# Patient Record
Sex: Male | Born: 1965 | Race: White | Hispanic: No | Marital: Married | Smoking: Former smoker
Health system: Southern US, Community
[De-identification: ages and names within clinical notes are randomized; demographics above are authoritative.]

## PROBLEM LIST (undated history)

## (undated) DIAGNOSIS — E119 Type 2 diabetes mellitus without complications: Secondary | ICD-10-CM

## (undated) DIAGNOSIS — G4730 Sleep apnea, unspecified: Secondary | ICD-10-CM

## (undated) DIAGNOSIS — M109 Gout, unspecified: Secondary | ICD-10-CM

## (undated) DIAGNOSIS — I10 Essential (primary) hypertension: Secondary | ICD-10-CM

## (undated) HISTORY — PX: CHOLECYSTECTOMY: SHX55

---

## 2006-02-12 ENCOUNTER — Emergency Department (HOSPITAL_COMMUNITY): Admission: EM | Admit: 2006-02-12 | Discharge: 2006-02-12 | Payer: Self-pay | Admitting: Emergency Medicine

## 2006-03-13 ENCOUNTER — Encounter (HOSPITAL_COMMUNITY): Admission: RE | Admit: 2006-03-13 | Discharge: 2006-04-07 | Payer: Self-pay | Admitting: Preventative Medicine

## 2013-02-27 ENCOUNTER — Emergency Department (HOSPITAL_COMMUNITY): Payer: Worker's Compensation

## 2013-02-27 ENCOUNTER — Emergency Department (HOSPITAL_COMMUNITY)
Admission: EM | Admit: 2013-02-27 | Discharge: 2013-02-27 | Disposition: A | Discharge status: Home / Self Care | Payer: Worker's Compensation | Attending: Emergency Medicine | Admitting: Emergency Medicine

## 2013-02-27 ENCOUNTER — Encounter (HOSPITAL_COMMUNITY): Payer: Self-pay | Admitting: Emergency Medicine

## 2013-02-27 DIAGNOSIS — Y9289 Other specified places as the place of occurrence of the external cause: Secondary | ICD-10-CM | POA: Insufficient documentation

## 2013-02-27 DIAGNOSIS — F17200 Nicotine dependence, unspecified, uncomplicated: Secondary | ICD-10-CM | POA: Insufficient documentation

## 2013-02-27 DIAGNOSIS — IMO0002 Reserved for concepts with insufficient information to code with codable children: Secondary | ICD-10-CM | POA: Insufficient documentation

## 2013-02-27 DIAGNOSIS — Z862 Personal history of diseases of the blood and blood-forming organs and certain disorders involving the immune mechanism: Secondary | ICD-10-CM | POA: Insufficient documentation

## 2013-02-27 DIAGNOSIS — Y9389 Activity, other specified: Secondary | ICD-10-CM | POA: Insufficient documentation

## 2013-02-27 DIAGNOSIS — X503XXA Overexertion from repetitive movements, initial encounter: Secondary | ICD-10-CM | POA: Insufficient documentation

## 2013-02-27 DIAGNOSIS — Y990 Civilian activity done for income or pay: Secondary | ICD-10-CM | POA: Insufficient documentation

## 2013-02-27 DIAGNOSIS — Z8669 Personal history of other diseases of the nervous system and sense organs: Secondary | ICD-10-CM | POA: Insufficient documentation

## 2013-02-27 DIAGNOSIS — Z8639 Personal history of other endocrine, nutritional and metabolic disease: Secondary | ICD-10-CM | POA: Insufficient documentation

## 2013-02-27 DIAGNOSIS — T148XXA Other injury of unspecified body region, initial encounter: Secondary | ICD-10-CM

## 2013-02-27 DIAGNOSIS — M549 Dorsalgia, unspecified: Secondary | ICD-10-CM

## 2013-02-27 HISTORY — DX: Gout, unspecified: M10.9

## 2013-02-27 HISTORY — DX: Sleep apnea, unspecified: G47.30

## 2013-02-27 LAB — BASIC METABOLIC PANEL
CO2: 27 mEq/L (ref 19–32)
Calcium: 10 mg/dL (ref 8.4–10.5)
Chloride: 101 mEq/L (ref 96–112)
GFR calc Af Amer: >90 mL/min (ref >90)
Sodium: 140 mEq/L (ref 135–145)

## 2013-02-27 LAB — CBC WITH DIFFERENTIAL/PLATELET
Basophils Absolute: 0.1 10*3/uL (ref 0.0–0.1)
Basophils Relative: 1 % (ref 0–1)
Eosinophils Relative: 5 % (ref 0–5)
Lymphocytes Relative: 25 % (ref 12–46)
MCV: 90.4 fL (ref 78.0–100.0)
Neutro Abs: 4.7 10*3/uL (ref 1.7–7.7)
Platelets: 173 10*3/uL (ref 150–400)
RDW: 14.2 % (ref 11.5–15.5)
WBC: 7.5 10*3/uL (ref 4.0–10.5)

## 2013-02-27 MED ORDER — HYDROMORPHONE HCL PF 1 MG/ML IJ SOLN
1.0000 mg | Freq: Once | INTRAMUSCULAR | Status: AC
  Administered 2013-02-27: 1 mg via INTRAVENOUS
  Filled 2013-02-27: qty 1

## 2013-02-27 MED ORDER — METHOCARBAMOL 500 MG PO TABS: 500.0000 mg | ORAL_TABLET | Freq: Two times a day (BID) | ORAL | Status: DC

## 2013-02-27 MED ORDER — HYDROCODONE-ACETAMINOPHEN 5-325 MG PO TABS: 1.0000 | ORAL_TABLET | Freq: Four times a day (QID) | ORAL | Status: DC | PRN

## 2013-02-27 MED ORDER — IBUPROFEN 600 MG PO TABS: 600.0000 mg | ORAL_TABLET | Freq: Four times a day (QID) | ORAL | Status: DC | PRN

## 2013-02-27 MED ORDER — KETOROLAC TROMETHAMINE 30 MG/ML IJ SOLN
30.0000 mg | Freq: Once | INTRAMUSCULAR | Status: AC
  Administered 2013-02-27: 30 mg via INTRAVENOUS
  Filled 2013-02-27: qty 1

## 2013-02-27 MED ORDER — DIAZEPAM 2 MG PO TABS
2.0000 mg | ORAL_TABLET | Freq: Once | ORAL | Status: AC
  Administered 2013-02-27: 2 mg via ORAL
  Filled 2013-02-27: qty 1

## 2013-02-27 NOTE — ED Notes (Signed)
Pt alert & oriented x4, stable gait. Patient given discharge instructions, paperwork & prescription(s). Patient instructed to stop at the registration desk to finish any additional paperwork. Patient verbalized understanding. Pt left department w/ no further questions. Pt walked to lab for drug screen.

## 2013-02-27 NOTE — ED Notes (Signed)
Pt was at work, and reach for tool and pull muscle in back, complaining of pain on right side.

## 2013-02-27 NOTE — ED Provider Notes (Signed)
CSN: [id number]     Arrival date & time 02/27/13  4098 History   First MD Initiated Contact with Patient 02/27/13 [phone]     Chief Complaint  Patient presents with  . Back Pain   (Consider location/radiation/quality/duration/timing/severity/associated sxs/prior Treatment) HPI Comments: Pt comes in with cc of back pain. No hx of back pain, medical problems. Was at work, and had just finished some lifting - and few minutes later had a severe pain on the right flank side. The pain is described as sharp, "grabbing" pain, which took his breath away, and he almost fell down. Pain got slightly better, but then returned again with same intensity. The pain is located on the right lower back region, no radiating to the legs, no associated numbness, weakness, urinary incontinence, urinary retention, bowel incontinence, saddle anestheisia. Pain is worse with deep inspiration, with any movement. Pt denies any trauma, no uti like sx, no scrotal/testiculat pain and no hx of hernias, no hx of renal stones.     The history is provided by the patient.    Past Medical History  Diagnosis Date  . Gout   . Sleep apnea    Past Surgical History  Procedure Laterality Date  . Cholecystectomy     No family history on file. History  Substance Use Topics  . Smoking status: Current Every Day Smoker    Types: Cigarettes  . Smokeless tobacco: Not on file  . Alcohol Use: No    Review of Systems  Constitutional: Positive for activity change. Negative for fever.  Respiratory: Negative for cough and shortness of breath.   Cardiovascular: Negative for chest pain.  Gastrointestinal: Negative for abdominal pain.  Genitourinary: Negative for dysuria.  Musculoskeletal: Positive for back pain. Negative for neck pain.  Neurological: Negative for dizziness and numbness.    Allergies  Augmentin  Home Medications  No current outpatient prescriptions on file. There were no vitals taken for this visit. Physical  Exam  Nursing note and vitals reviewed. Constitutional: He is oriented to person, place, and time. He appears well-developed.  HENT:  Head: Normocephalic and atraumatic.  Eyes: Conjunctivae and EOM are normal.  Neck: Normal range of motion. Neck supple.  Cardiovascular: Normal rate and regular rhythm.   Pulmonary/Chest: Effort normal and breath sounds normal. He has no wheezes.  Abdominal: Soft. Bowel sounds are normal. He exhibits no distension. There is no tenderness. There is no rebound and no guarding.  Musculoskeletal:  Pt has no midline spine or point tenderness. Pain reproduced with deep inspiration and with movement/twisting. Gait normal  Neurological: He is alert and oriented to person, place, and time.  Skin: Skin is warm.    ED Course  Procedures (including critical care time) Labs Review Labs Reviewed  CBC WITH DIFFERENTIAL  BASIC METABOLIC PANEL  URINALYSIS, ROUTINE W REFLEX MICROSCOPIC   Imaging Review No results found.  EKG Interpretation   None       MDM  No diagnosis found.  Pt comes in with cc of severe back pain, sudden onset. Pain started when he was reaching out for a tool. He has no hx of pain like this. Pain is pleuritic, and positional.  CXR r.o diaphragm injury.  This is likely m-s pain. Pain meds given. PCP f/u in 1 week.  Derwood Kaplan, MD 02/27/13 [phone]

## 2013-02-27 NOTE — ED Notes (Signed)
Pt states he reached across to get a tool & pain shot thru back. Middle right side.

## 2014-04-10 IMAGING — CR DG CHEST 1V PORT
1 series · 1 of 1 positions shown · non-contrast
Comparison: None.

CLINICAL DATA: Back pain.

EXAM:
PORTABLE CHEST - 1 VIEW

[portable]
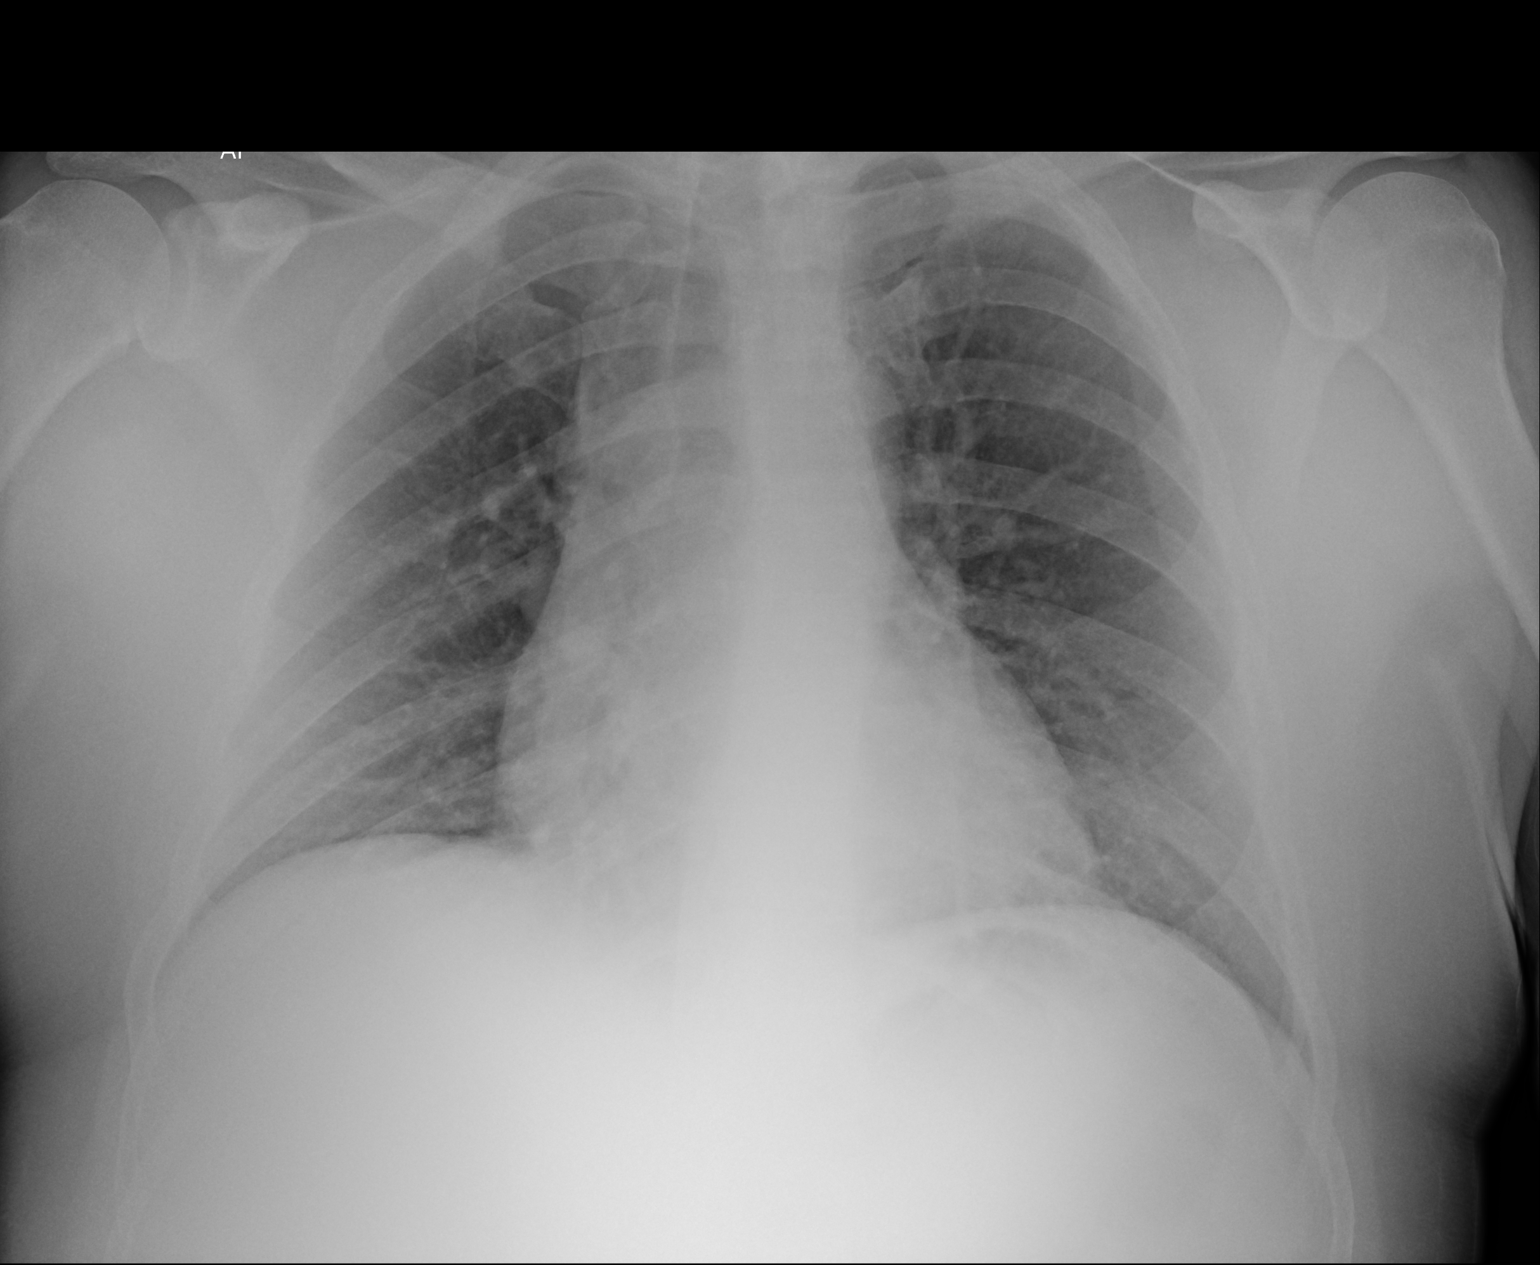

[1 of 1 positions shown; findings below may reference images not displayed]

FINDINGS: Heart size is normal. No pleural effusion or edema identified. No
airspace consolidation noted. The visualized osseous structures are
on unremarkable.
IMPRESSION: 1. No acute cardiopulmonary abnormalities.

## 2019-03-09 ENCOUNTER — Encounter (HOSPITAL_COMMUNITY): Payer: Self-pay | Admitting: Emergency Medicine

## 2019-03-09 ENCOUNTER — Other Ambulatory Visit: Payer: Self-pay

## 2019-03-09 ENCOUNTER — Emergency Department (HOSPITAL_COMMUNITY)
Admission: EM | Admit: 2019-03-09 | Discharge: 2019-03-09 | Disposition: A | Discharge status: Home / Self Care | Payer: Worker's Compensation | Attending: Emergency Medicine | Admitting: Emergency Medicine

## 2019-03-09 ENCOUNTER — Emergency Department (HOSPITAL_COMMUNITY): Payer: Worker's Compensation

## 2019-03-09 DIAGNOSIS — E119 Type 2 diabetes mellitus without complications: Secondary | ICD-10-CM | POA: Insufficient documentation

## 2019-03-09 DIAGNOSIS — S6992XA Unspecified injury of left wrist, hand and finger(s), initial encounter: Secondary | ICD-10-CM | POA: Diagnosis not present

## 2019-03-09 DIAGNOSIS — W228XXA Striking against or struck by other objects, initial encounter: Secondary | ICD-10-CM | POA: Insufficient documentation

## 2019-03-09 DIAGNOSIS — Y9289 Other specified places as the place of occurrence of the external cause: Secondary | ICD-10-CM | POA: Diagnosis not present

## 2019-03-09 DIAGNOSIS — Y990 Civilian activity done for income or pay: Secondary | ICD-10-CM | POA: Insufficient documentation

## 2019-03-09 DIAGNOSIS — I10 Essential (primary) hypertension: Secondary | ICD-10-CM | POA: Insufficient documentation

## 2019-03-09 DIAGNOSIS — F17210 Nicotine dependence, cigarettes, uncomplicated: Secondary | ICD-10-CM | POA: Diagnosis not present

## 2019-03-09 DIAGNOSIS — Y9389 Activity, other specified: Secondary | ICD-10-CM | POA: Diagnosis not present

## 2019-03-09 HISTORY — DX: Essential (primary) hypertension: I10

## 2019-03-09 HISTORY — DX: Type 2 diabetes mellitus without complications: E11.9

## 2019-03-09 MED ORDER — IBUPROFEN 400 MG PO TABS
400.0000 mg | ORAL_TABLET | Freq: Once | ORAL | Status: AC
  Administered 2019-03-09: 09:00:00 400 mg via ORAL
  Filled 2019-03-09: qty 1

## 2019-03-09 NOTE — ED Provider Notes (Addendum)
[facility] EMERGENCY DEPARTMENT Provider Note   CSN: [id number] Arrival date & time: 03/09/19  1950     History   Chief Complaint Chief Complaint  Patient presents with  . Hand Injury    HPI Clayton Gallegos is a 53 y.o. male.     Clayton Gallegos is a 53 y.o. male with a history of hypertension, diabetes, gout and sleep apnea, who presents to the ED for evaluation of left hand injury.  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  He reports that he was wearing leather gloves at the time and denies any cuts or abrasions.  He has noticed some bruising to the palm and swelling of the left hand.  Pain is most severe over the MCP joint of the middle finger, and bruising is present over the palmar aspect of this MCP joint.  Patient is able to move all fingers although it is painful.  He reports some decreased sensation but denies numbness.  He has not taken anything for pain prior to arrival.  He reports when he keeps the hand still he has very minimal pain.  No previous injuries or surgeries.  No other aggravating or alleviating factors.     Past Medical History:  Diagnosis Date  . Diabetes mellitus without complication (HCC)    Type II diet controlled  . Gout   . Hypertension   . Sleep apnea     There are no active problems to display for this patient.   Past Surgical History:  Procedure Laterality Date  . CHOLECYSTECTOMY          Home Medications    Prior to Admission medications   Medication Sig Start Date End Date Taking? Authorizing Provider  HYDROcodone-acetaminophen (NORCO/VICODIN) 5-325 MG per tablet Take 1 tablet by mouth every 6 (six) hours as needed. 02/27/13   Derwood Kaplan, MD  ibuprofen (ADVIL,MOTRIN) 600 MG tablet Take 1 tablet (600 mg total) by mouth every 6 (six) hours as needed. 02/27/13   Derwood Kaplan, MD  methocarbamol (ROBAXIN) 500 MG tablet Take 1  tablet (500 mg total) by mouth 2 (two) times daily. 02/27/13   Derwood Kaplan, MD    Family History History reviewed. No pertinent family history.  Social History Social History   Tobacco Use  . Smoking status: Current Every Day Smoker    Packs/day: 0.50    Types: Cigarettes  . Smokeless tobacco: Never Used  Substance Use Topics  . Alcohol use: No  . Drug use: No     Allergies   Augmentin [amoxicillin-pot clavulanate]   Review of Systems Review of Systems  Constitutional: Negative for chills and fever.  Musculoskeletal: Positive for arthralgias and joint swelling.  Skin: Positive for color change. Negative for wound.  Neurological: Negative for weakness and numbness.     Physical Exam Updated Vital Signs BP (!) 146/96 (BP Location: Right Arm)   Pulse 92   Temp 98.7 F (37.1 C) (Oral)   Resp 18   Ht 5\' 8"  (1.727 m)   Wt 113.4 kg   SpO2 98%   BMI 38.01 kg/m   Physical Exam Vitals signs and nursing note reviewed.  Constitutional:      General: He is not in acute distress.    Appearance: Normal appearance. He is well-developed. He is not ill-appearing or diaphoretic.  HENT:     Head:  Normocephalic and atraumatic.  Eyes:     General:        Right eye: No discharge.        Left eye: No discharge.  Pulmonary:     Effort: Pulmonary effort is normal. No respiratory distress.  Musculoskeletal:     Comments: Left hand with tenderness to palpation and swelling surrounding the MCP joint of the middle finger, there is bruising on the palmar aspect of the MCP joint, there is no obvious bony deformity.  No lacerations or abrasions.  No pain or tenderness at the wrist and patient has full range of motion.  Patient is able to give thumbs up, okay sign and spread and contracted fingers with some discomfort.  He has sensation present throughout the hand, 2+ radial pulse and good capillary refill.  Skin:    General: Skin is warm and dry.     Capillary Refill: Capillary  refill takes less than 2 seconds.  Neurological:     Mental Status: He is alert and oriented to person, place, and time.     Coordination: Coordination normal.  Psychiatric:        Mood and Affect: Mood normal.        Behavior: Behavior normal.      ED Treatments / Results  Labs (all labs ordered are listed, but only abnormal results are displayed) Labs Reviewed - No data to display  EKG None  Radiology Dg Hand Complete Left  Result Date: 03/09/2019 CLINICAL DATA:  Piece of metal fell on hand EXAM: LEFT HAND - COMPLETE 3+ VIEW COMPARISON:  None. FINDINGS: Frontal, oblique, and lateral views were obtained. There is soft tissue swelling over the dorsal aspect of the hand at the MCP joint level. No fracture or dislocation. Joint spaces appear normal. No erosive change. No radiopaque foreign body or soft tissue air. IMPRESSION: No fracture or dislocation. Soft tissue swelling dorsally at the MCP joint level. No appreciable arthropathy. No radiopaque foreign body. Electronically Signed   By: Lowella Grip III M.D.   On: 03/09/2019 07:49    Procedures Procedures (including critical care time)  Medications Ordered in ED Medications  ibuprofen (ADVIL) tablet 400 mg (has no administration in time range)     Initial Impression / Assessment and Plan / ED Course  I have reviewed the triage vital signs and the nursing notes.  Pertinent labs & imaging results that were available during my care of the patient were reviewed by me and considered in my medical decision making (see chart for details).  53 year old male presents after left hand injury just prior to arrival while at work.  Fortunately the patient was wearing leather gloves and has no cuts or abrasions to the hand but has pain and swelling and some bruising around the third MCP joint.  No obvious bony deformity.  Hand is neurovascularly intact.  X-ray shows no fracture or dislocation, but soft tissue swelling is noted at the MCP  joint consistent with exam.  Will place patient in Ace wrap for comfort, suspect soft tissue injury this should improve, ice, elevation, ibuprofen and Tylenol recommended for treatment.  Orthopedic follow-up if not improving.  Return precautions discussed.  Patient expresses understanding and agreement with plan.  Discharged home in good condition.  Final Clinical Impressions(s) / ED Diagnoses   Final diagnoses:  Injury of left hand, initial encounter    ED Discharge Orders    None       Jacqlyn Larsen, [place] 03/09/19 8185  Dartha LodgeFord, Jannis Atkins N, PA-C 03/09/19 1135    Vanetta MuldersZackowski, Scott, MD 03/10/19 [phone]

## 2019-03-09 NOTE — Discharge Instructions (Signed)
Your x-ray shows soft tissue swelling but no fractures.  Use Ace wrap for support and compression, ice and elevate the hand as much as possible over the next few days, and take ibuprofen 400 mg, and Tylenol 1000 mg, both of these can be taken every 6 hours.  If symptoms are not improving please follow-up with Dr. Aline Brochure with orthopedics.  As your symptoms improve you can continue to return to your regular activities as tolerated.

## 2019-03-09 NOTE — ED Triage Notes (Signed)
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 No cuts or abrasions noted and pt reports he was wearing leather gloves. Bruising noted to palm of left hand and swelling.

## 2020-04-19 IMAGING — DX DG HAND COMPLETE 3+V*L*
3 series · 3 of 3 positions shown · non-contrast
Comparison: None.

CLINICAL DATA: Piece of metal fell on hand

EXAM:
LEFT HAND - COMPLETE 3+ VIEW

[hand pa]
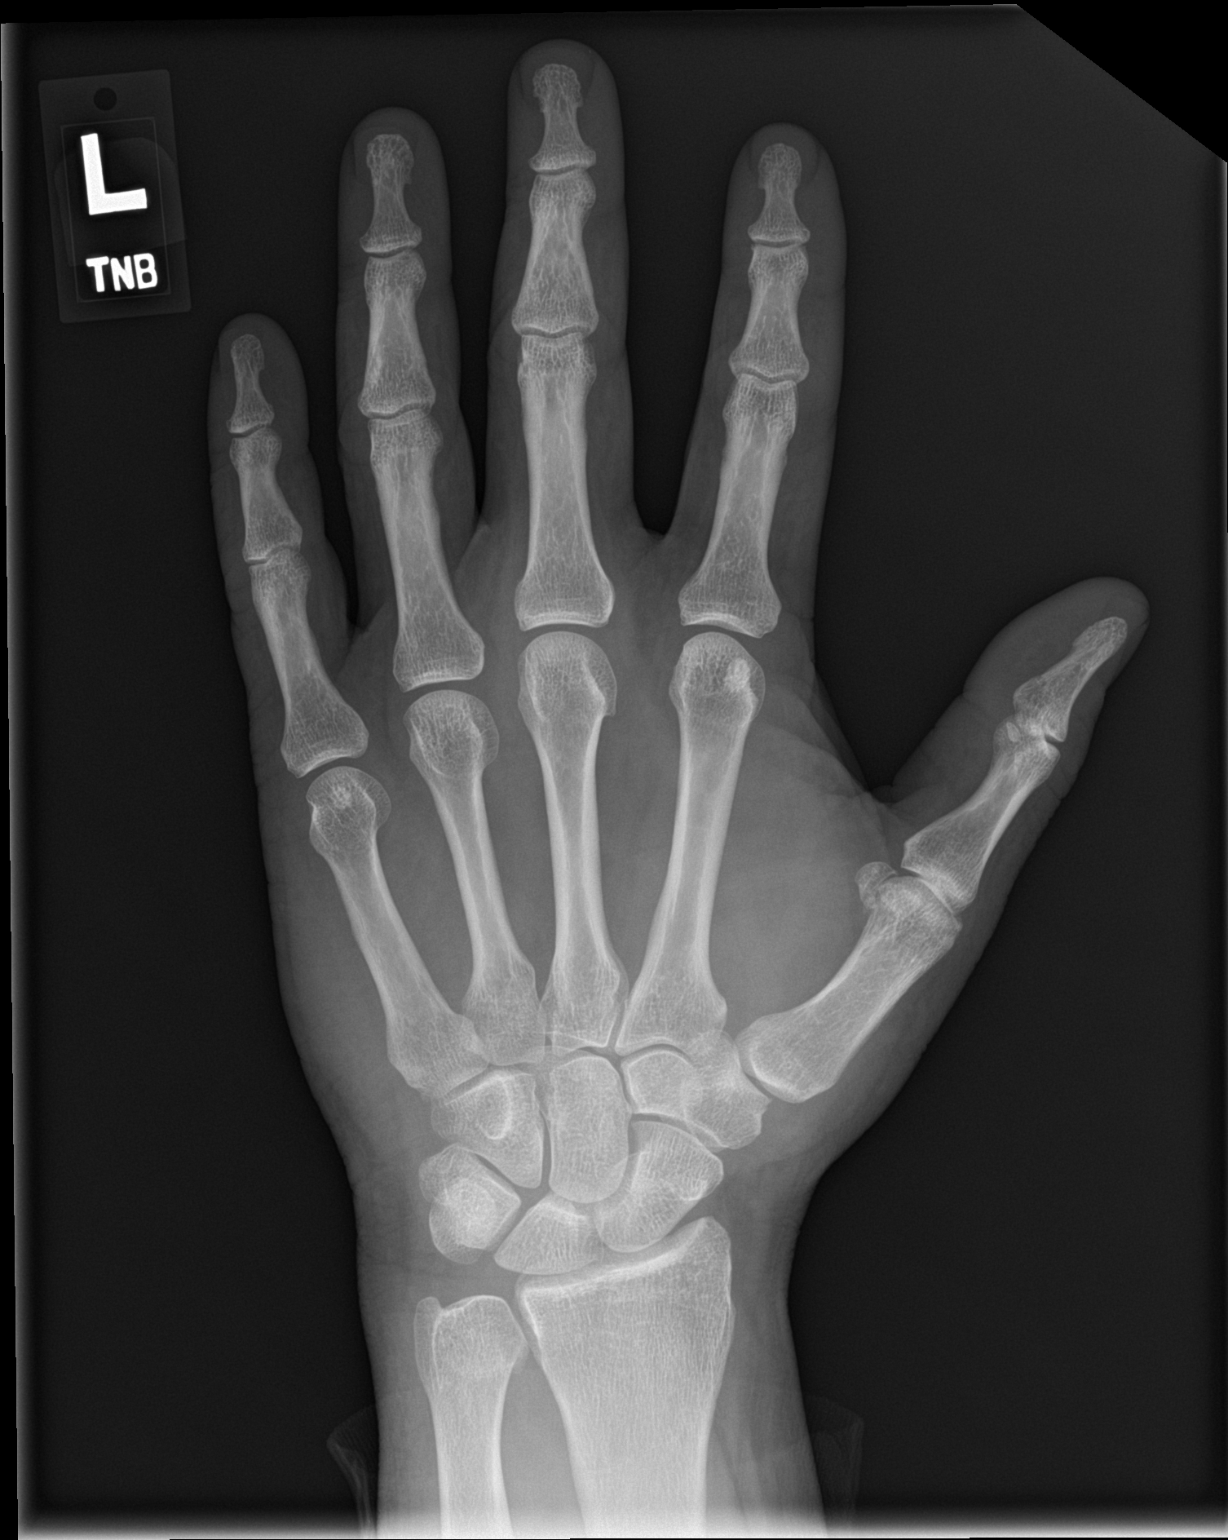

[hand obl]
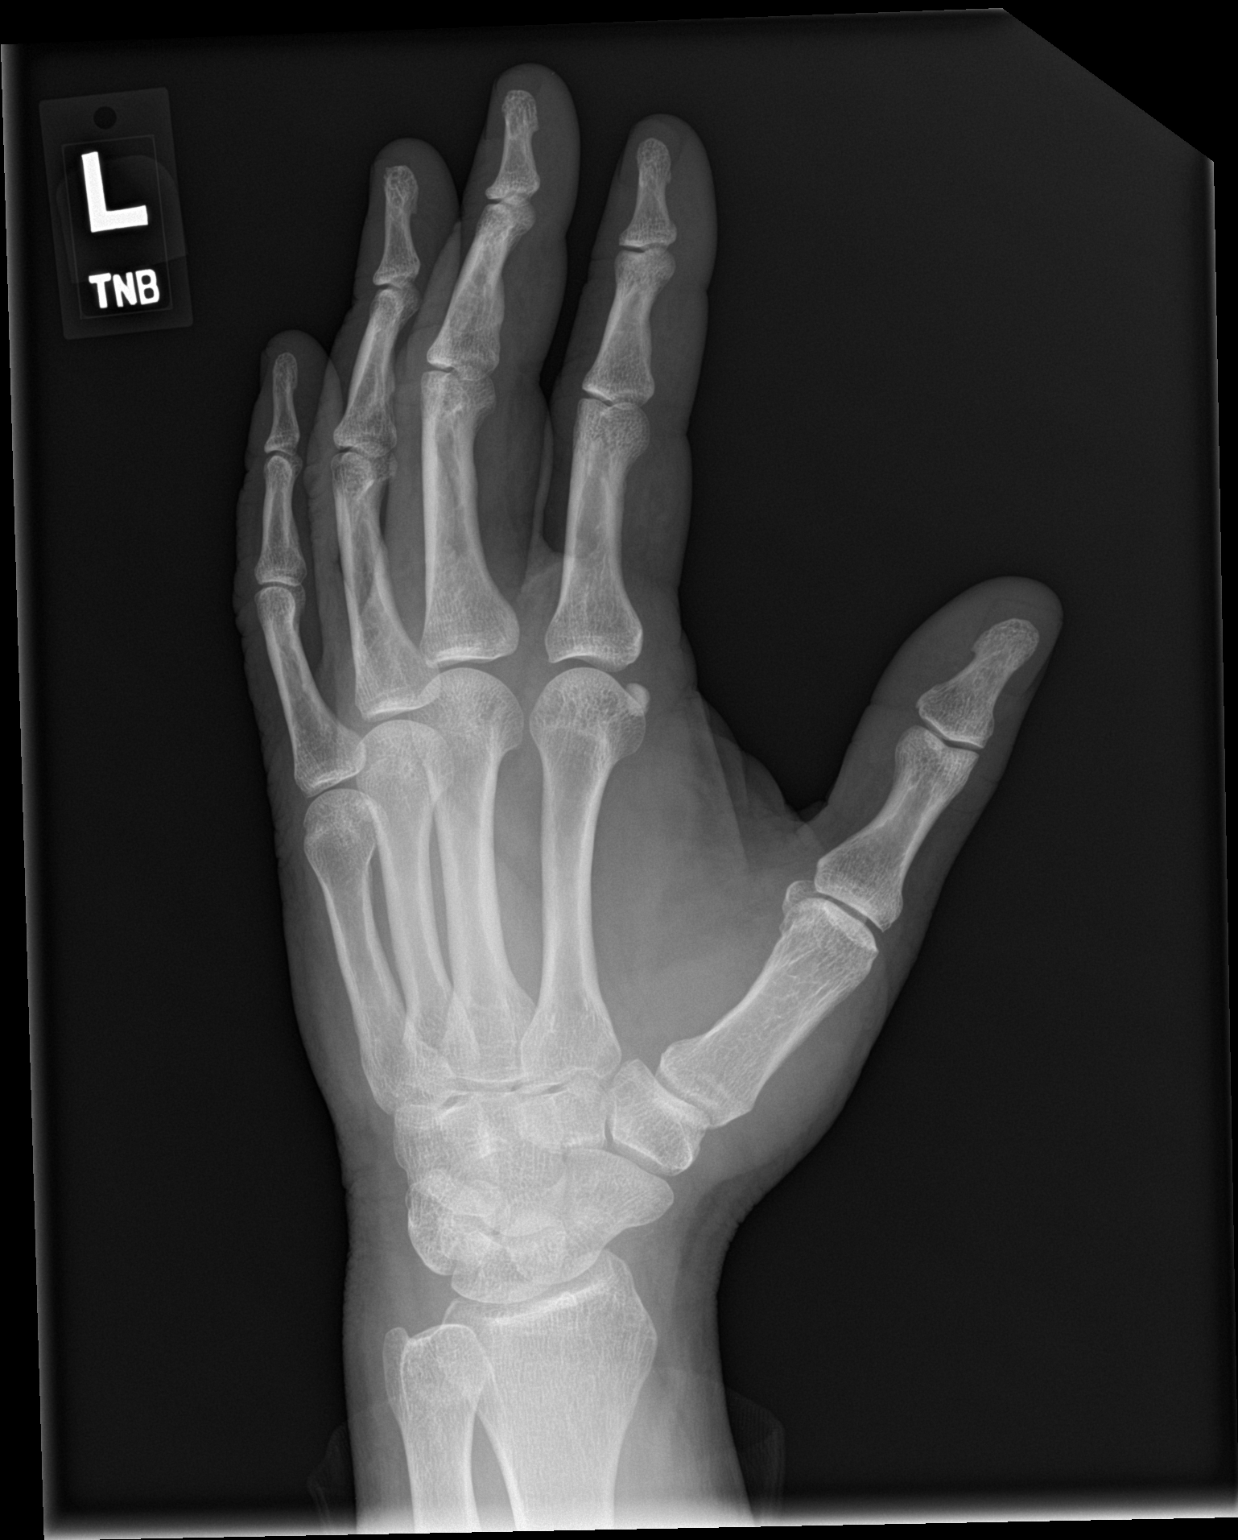

[hand lat]
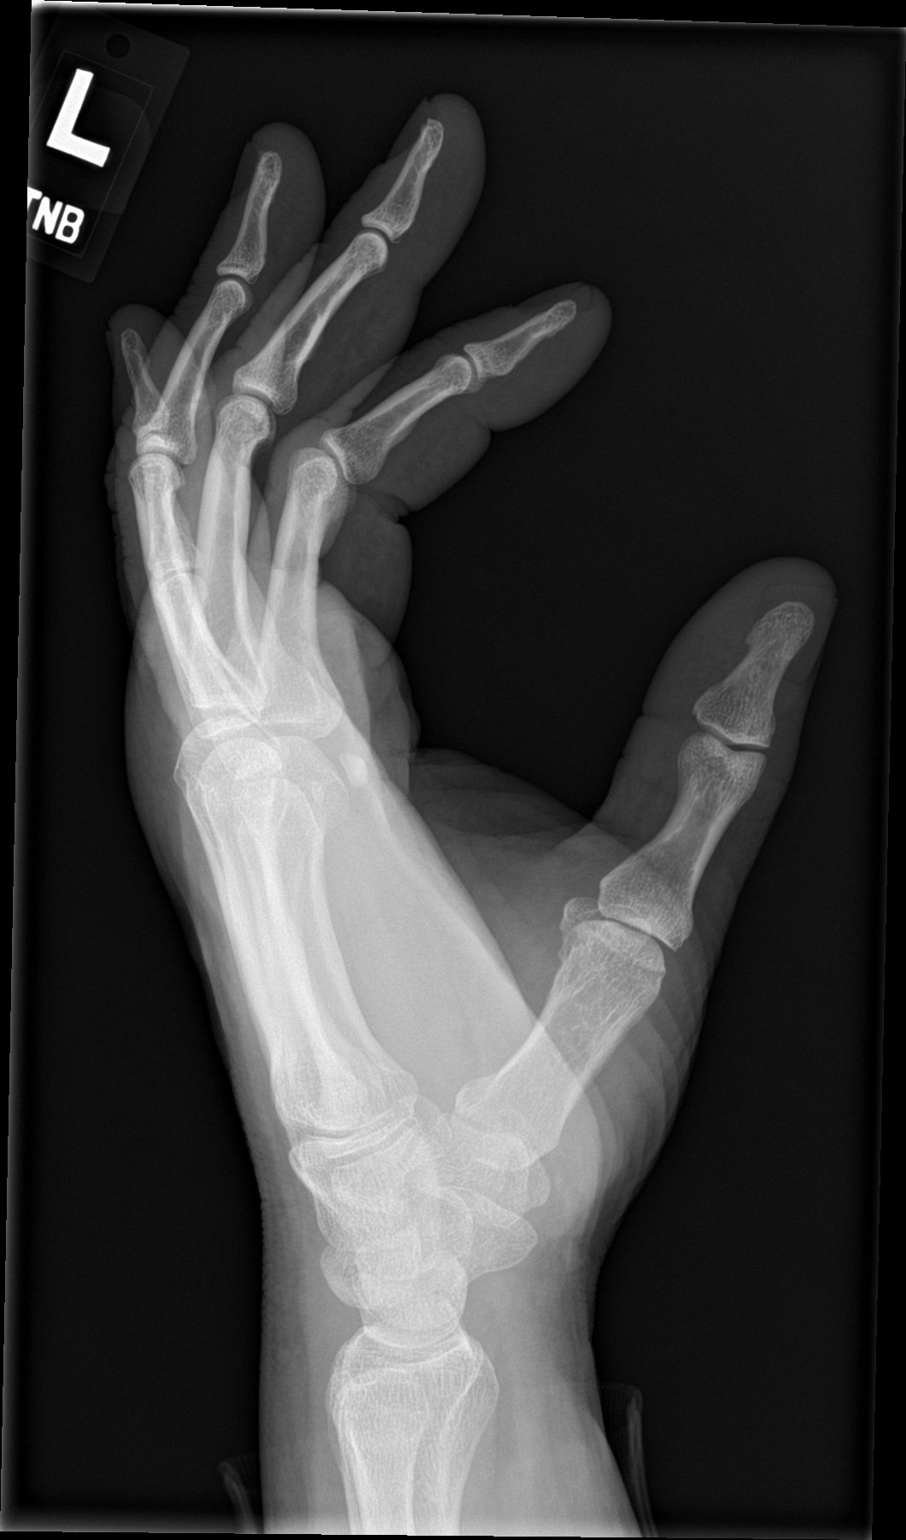

[3 of 3 positions shown; findings below may reference images not displayed]

FINDINGS: Frontal, oblique, and lateral views were obtained. There is soft
tissue swelling over the dorsal aspect of the hand at the MCP joint
level. No fracture or dislocation. Joint spaces appear normal. No
erosive change. No radiopaque foreign body or soft tissue air.
IMPRESSION: No fracture or dislocation. Soft tissue swelling dorsally at the MCP
joint level. No appreciable arthropathy. No radiopaque foreign body.

## 2021-05-18 NOTE — Progress Notes (Signed)
 Acute Care Office Visit:   [facility] 05/18/2021 with Westley KATHEE Lucks, MD    Assessment/Plan:   Encounter Diagnosis    ICD-10-CM   1. Acute bronchitis, unspecified organism  J20.9     2. 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 Will treat with Z pack and prednisone burst. Cough medicine sent. UC/ER precautions given extensively.   Rx for clobetasol ointment sent .   I have discussed the plan of care including any new or otc medications, labs as needed, imaging and follow up with pt. He is in agreement with plan of care and voices understanding of the above plan.   Subjective:    Chief Complaint  Patient presents with  . Nasal Congestion    Started Monday night. Started out as sneezing Monday night.   . Chest Congestion   EMRAN MOLZAHN is a 56 y.o. male who presents with the above issues.   rnny nose, congestion 4 days ago; heavy mucus and cough for 2 weeks prior to this.  Negative covid test yesterday.  Clear mucus from the nose.  Mostly a dry cough Running a fever yesterday, subjective.  some COPD  Patient denies fevers, chills, shortness of breath.  Would like a refill of clobetasol ointment instead of cream for history of psoriasis-like rash of the hands   Review of Systems: ROS  Objective:   Physical Examination: BP (!) 148/78   Pulse 93   Temp 97.8 F (36.6 C) (Forehead Scan)   Resp 16   Ht 1.727 m (5' 8)   Wt 95.3 kg (210 lb)   SpO2 96%   BMI 31.93 kg/m  Estimated body mass index is 31.93 kg/m as calculated from the following:   Height as of this encounter: 1.727 m (5' 8).   Weight as of this encounter: 95.3 kg (210 lb). Physical Exam Constitutional:      General: He is not in acute distress.    Appearance: He is not ill-appearing or toxic-appearing.   HENT:     Right Ear: Tympanic membrane normal.     Left Ear: Tympanic membrane normal.     Nose: Nose normal. No congestion.  Cardiovascular:     Rate and Rhythm: Normal rate and regular rhythm.     Pulses: Normal pulses.     Heart sounds: No murmur heard. Pulmonary:     Effort: Pulmonary effort is normal. No respiratory distress.     Breath sounds: Wheezing and rhonchi present.  Skin:    Findings: Lesion present.  Neurological:     Mental Status: He is alert.     Annular plaques on the hands.

## 2023-10-20 ENCOUNTER — Emergency Department (HOSPITAL_COMMUNITY)

## 2023-10-20 ENCOUNTER — Encounter (HOSPITAL_COMMUNITY): Payer: Self-pay | Admitting: Cardiology

## 2023-10-20 ENCOUNTER — Other Ambulatory Visit: Payer: Self-pay

## 2023-10-20 ENCOUNTER — Inpatient Hospital Stay (HOSPITAL_COMMUNITY)
Admission: EM | Admit: 2023-10-20 | Discharge: 2023-10-21 | DRG: 322 | Disposition: A | Discharge status: Home / Self Care | Attending: Cardiology | Admitting: Cardiology

## 2023-10-20 ENCOUNTER — Encounter (HOSPITAL_COMMUNITY)
Admission: EM | Disposition: A | Discharge status: Home / Self Care | Payer: Self-pay | Source: Home / Self Care | Attending: Cardiology

## 2023-10-20 DIAGNOSIS — Q271 Congenital renal artery stenosis: Secondary | ICD-10-CM

## 2023-10-20 DIAGNOSIS — I10 Essential (primary) hypertension: Secondary | ICD-10-CM | POA: Diagnosis present

## 2023-10-20 DIAGNOSIS — I701 Atherosclerosis of renal artery: Secondary | ICD-10-CM | POA: Diagnosis present

## 2023-10-20 DIAGNOSIS — Z79899 Other long term (current) drug therapy: Secondary | ICD-10-CM

## 2023-10-20 DIAGNOSIS — Z8249 Family history of ischemic heart disease and other diseases of the circulatory system: Secondary | ICD-10-CM | POA: Diagnosis not present

## 2023-10-20 DIAGNOSIS — E785 Hyperlipidemia, unspecified: Secondary | ICD-10-CM | POA: Diagnosis present

## 2023-10-20 DIAGNOSIS — I252 Old myocardial infarction: Secondary | ICD-10-CM | POA: Diagnosis not present

## 2023-10-20 DIAGNOSIS — E118 Type 2 diabetes mellitus with unspecified complications: Secondary | ICD-10-CM | POA: Insufficient documentation

## 2023-10-20 DIAGNOSIS — I214 Non-ST elevation (NSTEMI) myocardial infarction: Secondary | ICD-10-CM | POA: Diagnosis present

## 2023-10-20 DIAGNOSIS — E119 Type 2 diabetes mellitus without complications: Secondary | ICD-10-CM | POA: Diagnosis present

## 2023-10-20 DIAGNOSIS — F17210 Nicotine dependence, cigarettes, uncomplicated: Secondary | ICD-10-CM | POA: Diagnosis present

## 2023-10-20 DIAGNOSIS — I2510 Atherosclerotic heart disease of native coronary artery without angina pectoris: Secondary | ICD-10-CM | POA: Diagnosis present

## 2023-10-20 DIAGNOSIS — Z7982 Long term (current) use of aspirin: Secondary | ICD-10-CM

## 2023-10-20 DIAGNOSIS — Z955 Presence of coronary angioplasty implant and graft: Secondary | ICD-10-CM

## 2023-10-20 DIAGNOSIS — I255 Ischemic cardiomyopathy: Secondary | ICD-10-CM | POA: Diagnosis present

## 2023-10-20 HISTORY — PX: CORONARY PRESSURE/FFR STUDY: CATH118243

## 2023-10-20 HISTORY — PX: LEFT HEART CATH AND CORONARY ANGIOGRAPHY: CATH118249

## 2023-10-20 HISTORY — PX: CORONARY STENT INTERVENTION: CATH118234

## 2023-10-20 LAB — POCT ACTIVATED CLOTTING TIME
Activated Clotting Time: 262 s
Activated Clotting Time: 274 s
Activated Clotting Time: 297 s

## 2023-10-20 LAB — CBC
HCT: 41.1 % (ref 39.0–52.0)
Hemoglobin: 14.5 g/dL (ref 13.0–17.0)
MCH: 33.3 pg (ref 26.0–34.0)
MCHC: 35.3 g/dL (ref 30.0–36.0)
MCV: 94.5 fL (ref 80.0–100.0)
Platelets: 207 K/uL (ref 150–400)
RBC: 4.35 MIL/uL (ref 4.22–5.81)
RDW: 14.6 % (ref 11.5–15.5)
WBC: 8.9 K/uL (ref 4.0–10.5)
nRBC: 0 % (ref 0.0–0.2)

## 2023-10-20 LAB — GLUCOSE, CAPILLARY
Glucose-Capillary: 185 mg/dL — ABNORMAL HIGH (ref 70–99)
Glucose-Capillary: 259 mg/dL — ABNORMAL HIGH (ref 70–99)

## 2023-10-20 LAB — BASIC METABOLIC PANEL WITH GFR
Anion gap: 12 (ref 5–15)
BUN: 17 mg/dL (ref 6–20)
CO2: 21 mmol/L — ABNORMAL LOW (ref 22–32)
Calcium: 8.6 mg/dL — ABNORMAL LOW (ref 8.9–10.3)
Chloride: 97 mmol/L — ABNORMAL LOW (ref 98–111)
Creatinine, Ser: 1.11 mg/dL (ref 0.61–1.24)
GFR, Estimated: >60 mL/min (ref >60)
Glucose, Bld: 404 mg/dL — ABNORMAL HIGH (ref 70–99)
Potassium: 4.1 mmol/L (ref 3.5–5.1)
Sodium: 130 mmol/L — ABNORMAL LOW (ref 135–145)

## 2023-10-20 LAB — TROPONIN I (HIGH SENSITIVITY)
Troponin I (High Sensitivity): 16 ng/L (ref <18)
Troponin I (High Sensitivity): 68 ng/L — ABNORMAL HIGH (ref <18)
Troponin I (High Sensitivity): 73 ng/L — ABNORMAL HIGH (ref <18)

## 2023-10-20 LAB — HIV ANTIBODY (ROUTINE TESTING W REFLEX): HIV Screen 4th Generation wRfx: NONREACTIVE

## 2023-10-20 LAB — CBG MONITORING, ED
Glucose-Capillary: 299 mg/dL — ABNORMAL HIGH (ref 70–99)
Glucose-Capillary: 172 mg/dL — ABNORMAL HIGH (ref 70–99)

## 2023-10-20 SURGERY — LEFT HEART CATH AND CORONARY ANGIOGRAPHY
Anesthesia: LOCAL

## 2023-10-20 MED ORDER — MIDAZOLAM HCL 2 MG/2ML IJ SOLN
INTRAMUSCULAR | Status: AC
  Filled 2023-10-20: qty 2

## 2023-10-20 MED ORDER — METOPROLOL TARTRATE 12.5 MG HALF TABLET
12.5000 mg | ORAL_TABLET | Freq: Two times a day (BID) | ORAL | Status: DC
  Administered 2023-10-20 – 2023-10-21 (×3): 12.5 mg via ORAL
  Filled 2023-10-20 (×3): qty 1

## 2023-10-20 MED ORDER — FENTANYL CITRATE (PF) 100 MCG/2ML IJ SOLN
INTRAMUSCULAR | Status: AC
Start: 2023-10-20 — End: 2023-10-20
  Filled 2023-10-20: qty 2

## 2023-10-20 MED ORDER — ASPIRIN 81 MG PO TBEC
81.0000 mg | DELAYED_RELEASE_TABLET | Freq: Every day | ORAL | Status: DC
  Administered 2023-10-20 – 2023-10-21 (×2): 81 mg via ORAL
  Filled 2023-10-20 (×2): qty 1

## 2023-10-20 MED ORDER — PANTOPRAZOLE SODIUM 40 MG PO TBEC
40.0000 mg | DELAYED_RELEASE_TABLET | Freq: Every day | ORAL | Status: DC
  Administered 2023-10-20 – 2023-10-21 (×2): 40 mg via ORAL
  Filled 2023-10-20 (×2): qty 1

## 2023-10-20 MED ORDER — ATORVASTATIN CALCIUM 80 MG PO TABS
80.0000 mg | ORAL_TABLET | Freq: Every day | ORAL | Status: DC
  Administered 2023-10-20 – 2023-10-21 (×2): 80 mg via ORAL
  Filled 2023-10-20: qty 2
  Filled 2023-10-20: qty 1

## 2023-10-20 MED ORDER — IOHEXOL 350 MG/ML SOLN
INTRAVENOUS | Status: DC | PRN
  Administered 2023-10-20: 170 mL

## 2023-10-20 MED ORDER — HEPARIN (PORCINE) IN NACL 1000-0.9 UT/500ML-% IV SOLN
INTRAVENOUS | Status: DC | PRN
  Administered 2023-10-20 (×2): 500 mL

## 2023-10-20 MED ORDER — ACETAMINOPHEN 325 MG PO TABS: 650.0000 mg | ORAL_TABLET | ORAL | Status: DC | PRN

## 2023-10-20 MED ORDER — HEPARIN SODIUM (PORCINE) 1000 UNIT/ML IJ SOLN
INTRAMUSCULAR | Status: AC
Start: 2023-10-20 — End: 2023-10-20
  Filled 2023-10-20: qty 10

## 2023-10-20 MED ORDER — SODIUM CHLORIDE 0.9 % WEIGHT BASED INFUSION: 1.0000 mL/kg/h | INTRAVENOUS | Status: DC

## 2023-10-20 MED ORDER — ONDANSETRON HCL 4 MG/2ML IJ SOLN
4.0000 mg | Freq: Four times a day (QID) | INTRAMUSCULAR | Status: DC | PRN
  Administered 2023-10-20: 4 mg via INTRAVENOUS
  Filled 2023-10-20: qty 2

## 2023-10-20 MED ORDER — FENTANYL CITRATE PF 50 MCG/ML IJ SOSY: 50.0000 ug | PREFILLED_SYRINGE | INTRAMUSCULAR | Status: DC | PRN

## 2023-10-20 MED ORDER — TICAGRELOR 90 MG PO TABS
ORAL_TABLET | ORAL | Status: AC
  Filled 2023-10-20: qty 2

## 2023-10-20 MED ORDER — LIDOCAINE HCL (PF) 1 % IJ SOLN
INTRAMUSCULAR | Status: DC | PRN
Start: 2023-10-20 — End: 2023-10-20
  Administered 2023-10-20: 2 mL

## 2023-10-20 MED ORDER — VERAPAMIL HCL 2.5 MG/ML IV SOLN
INTRAVENOUS | Status: DC | PRN
  Administered 2023-10-20: 10 mL via INTRA_ARTERIAL

## 2023-10-20 MED ORDER — NITROGLYCERIN 1 MG/10 ML FOR IR/CATH LAB
INTRA_ARTERIAL | Status: AC
  Filled 2023-10-20: qty 10

## 2023-10-20 MED ORDER — HEPARIN BOLUS VIA INFUSION
4000.0000 [IU] | Freq: Once | INTRAVENOUS | Status: AC
  Administered 2023-10-20: 4000 [IU] via INTRAVENOUS

## 2023-10-20 MED ORDER — NITROGLYCERIN 1 MG/10 ML FOR IR/CATH LAB
INTRA_ARTERIAL | Status: DC | PRN
Start: 2023-10-20 — End: 2023-10-20
  Administered 2023-10-20 (×2): 200 ug via INTRACORONARY

## 2023-10-20 MED ORDER — INSULIN ASPART 100 UNIT/ML IV SOLN
6.0000 [IU] | Freq: Once | INTRAVENOUS | Status: AC
  Administered 2023-10-20: 6 [IU] via INTRAVENOUS

## 2023-10-20 MED ORDER — NITROGLYCERIN 0.4 MG SL SUBL: 0.4000 mg | SUBLINGUAL_TABLET | SUBLINGUAL | Status: DC | PRN

## 2023-10-20 MED ORDER — INSULIN ASPART 100 UNIT/ML IJ SOLN
0.0000 [IU] | Freq: Three times a day (TID) | INTRAMUSCULAR | Status: DC
  Administered 2023-10-21: 3 [IU] via SUBCUTANEOUS
  Administered 2023-10-21: 5 [IU] via SUBCUTANEOUS

## 2023-10-20 MED ORDER — MIDAZOLAM HCL 2 MG/2ML IJ SOLN
INTRAMUSCULAR | Status: DC | PRN
  Administered 2023-10-20: 1 mg via INTRAVENOUS

## 2023-10-20 MED ORDER — SERTRALINE HCL 50 MG PO TABS
50.0000 mg | ORAL_TABLET | Freq: Every day | ORAL | Status: DC
  Administered 2023-10-20 – 2023-10-21 (×2): 50 mg via ORAL
  Filled 2023-10-20 (×2): qty 1

## 2023-10-20 MED ORDER — SODIUM CHLORIDE 0.9 % IV SOLN: INTRAVENOUS | Status: AC

## 2023-10-20 MED ORDER — SODIUM CHLORIDE 0.9% FLUSH: 3.0000 mL | INTRAVENOUS | Status: DC | PRN

## 2023-10-20 MED ORDER — TICAGRELOR 90 MG PO TABS
ORAL_TABLET | ORAL | Status: AC
  Filled 2023-10-20: qty 1

## 2023-10-20 MED ORDER — IOHEXOL 350 MG/ML SOLN
100.0000 mL | Freq: Once | INTRAVENOUS | Status: AC | PRN
  Administered 2023-10-20: 100 mL via INTRAVENOUS

## 2023-10-20 MED ORDER — ASPIRIN 81 MG PO CHEW: 324.0000 mg | CHEWABLE_TABLET | Freq: Once | ORAL | Status: DC

## 2023-10-20 MED ORDER — VERAPAMIL HCL 2.5 MG/ML IV SOLN
INTRAVENOUS | Status: AC
  Filled 2023-10-20: qty 2

## 2023-10-20 MED ORDER — FAMOTIDINE IN NACL 20-0.9 MG/50ML-% IV SOLN
20.0000 mg | Freq: Once | INTRAVENOUS | Status: AC
  Administered 2023-10-20: 20 mg via INTRAVENOUS
  Filled 2023-10-20: qty 50

## 2023-10-20 MED ORDER — SODIUM CHLORIDE 0.9% FLUSH
3.0000 mL | Freq: Two times a day (BID) | INTRAVENOUS | Status: DC
  Administered 2023-10-21: 3 mL via INTRAVENOUS

## 2023-10-20 MED ORDER — ALUM & MAG HYDROXIDE-SIMETH 200-200-20 MG/5ML PO SUSP
30.0000 mL | Freq: Once | ORAL | Status: AC
  Administered 2023-10-20: 30 mL via ORAL
  Filled 2023-10-20: qty 30

## 2023-10-20 MED ORDER — SODIUM CHLORIDE 0.9 % IV BOLUS
1000.0000 mL | Freq: Once | INTRAVENOUS | Status: AC
  Administered 2023-10-20: 1000 mL via INTRAVENOUS

## 2023-10-20 MED ORDER — INSULIN ASPART 100 UNIT/ML IJ SOLN
0.0000 [IU] | Freq: Every day | INTRAMUSCULAR | Status: DC
  Administered 2023-10-20: 3 [IU] via SUBCUTANEOUS

## 2023-10-20 MED ORDER — SODIUM CHLORIDE 0.9 % IV SOLN: 250.0000 mL | INTRAVENOUS | Status: DC | PRN

## 2023-10-20 MED ORDER — SODIUM CHLORIDE 0.9 % WEIGHT BASED INFUSION
3.0000 mL/kg/h | INTRAVENOUS | Status: AC
  Administered 2023-10-20: 3 mL/kg/h via INTRAVENOUS

## 2023-10-20 MED ORDER — FENTANYL CITRATE (PF) 100 MCG/2ML IJ SOLN
INTRAMUSCULAR | Status: DC | PRN
  Administered 2023-10-20: 25 ug via INTRAVENOUS
  Administered 2023-10-20: 50 ug via INTRAVENOUS

## 2023-10-20 MED ORDER — LIDOCAINE VISCOUS HCL 2 % MT SOLN
15.0000 mL | Freq: Once | OROMUCOSAL | Status: AC
  Administered 2023-10-20: 15 mL via ORAL
  Filled 2023-10-20: qty 15

## 2023-10-20 MED ORDER — LIDOCAINE HCL (PF) 1 % IJ SOLN
INTRAMUSCULAR | Status: AC
  Filled 2023-10-20: qty 30

## 2023-10-20 MED ORDER — ADENOSINE 12 MG/4ML IV SOLN
INTRAVENOUS | Status: AC
  Filled 2023-10-20: qty 8

## 2023-10-20 MED ORDER — HEPARIN (PORCINE) 25000 UT/250ML-% IV SOLN
1100.0000 [IU]/h | INTRAVENOUS | Status: DC
  Administered 2023-10-20: 1100 [IU]/h via INTRAVENOUS
  Filled 2023-10-20: qty 250

## 2023-10-20 MED ORDER — TICAGRELOR 90 MG PO TABS
90.0000 mg | ORAL_TABLET | Freq: Two times a day (BID) | ORAL | Status: DC
  Administered 2023-10-21: 90 mg via ORAL
  Filled 2023-10-20: qty 1

## 2023-10-20 MED ORDER — HYDRALAZINE HCL 20 MG/ML IJ SOLN: 10.0000 mg | INTRAMUSCULAR | Status: AC | PRN

## 2023-10-20 MED ORDER — HEPARIN SODIUM (PORCINE) 1000 UNIT/ML IJ SOLN
INTRAMUSCULAR | Status: AC
  Filled 2023-10-20: qty 10

## 2023-10-20 MED ORDER — TICAGRELOR 90 MG PO TABS
ORAL_TABLET | ORAL | Status: DC | PRN
  Administered 2023-10-20: 180 mg via ORAL

## 2023-10-20 MED ORDER — ENOXAPARIN SODIUM 40 MG/0.4ML IJ SOSY
40.0000 mg | PREFILLED_SYRINGE | INTRAMUSCULAR | Status: DC
  Administered 2023-10-21: 40 mg via SUBCUTANEOUS
  Filled 2023-10-20: qty 0.4

## 2023-10-20 MED ORDER — HEPARIN SODIUM (PORCINE) 1000 UNIT/ML IJ SOLN
INTRAMUSCULAR | Status: DC | PRN
  Administered 2023-10-20 (×2): 5000 [IU] via INTRAVENOUS
  Administered 2023-10-20: 3000 [IU] via INTRAVENOUS
  Administered 2023-10-20: 2000 [IU] via INTRAVENOUS

## 2023-10-20 MED ORDER — LABETALOL HCL 5 MG/ML IV SOLN: 10.0000 mg | INTRAVENOUS | Status: AC | PRN

## 2023-10-20 MED ORDER — NITROGLYCERIN 2 % TD OINT
1.0000 [in_us] | TOPICAL_OINTMENT | Freq: Once | TRANSDERMAL | Status: AC
  Administered 2023-10-20: 1 [in_us] via TOPICAL
  Filled 2023-10-20: qty 1

## 2023-10-20 SURGICAL SUPPLY — 15 items
BALLOON EMERGE MR 2.0X12 (BALLOONS) IMPLANT
BALLOON ~~LOC~~ EMERGE MR 2.5X12 (BALLOONS) IMPLANT
CATH 5FR JL3.5 JR4 ANG PIG MP (CATHETERS) IMPLANT
CATH VISTA GUIDE 6FR XBLD 3.5 (CATHETERS) IMPLANT
DEVICE RAD COMP TR BAND LRG (VASCULAR PRODUCTS) IMPLANT
GLIDESHEATH SLEND SS 6F .021 (SHEATH) IMPLANT
GUIDEWIRE INQWIRE 1.5J.035X260 (WIRE) IMPLANT
GUIDEWIRE PRESSURE X 175 (WIRE) IMPLANT
KIT ENCORE 26 ADVANTAGE (KITS) IMPLANT
KIT ESSENTIALS PG (KITS) IMPLANT
KIT SYRINGE INJ CVI SPIKEX1 (MISCELLANEOUS) IMPLANT
PACK CARDIAC CATHETERIZATION (CUSTOM PROCEDURE TRAY) ×1 IMPLANT
SET ATX-X65L (MISCELLANEOUS) IMPLANT
STENT SYNERGY XD 2.25X16 (Permanent Stent) IMPLANT
WIRE RUNTHROUGH IZANAI 014 180 (WIRE) IMPLANT

## 2023-10-20 NOTE — Inpatient Diabetes Management (Signed)
 Inpatient Diabetes Program Recommendations  AACE/ADA: New Consensus Statement on Inpatient Glycemic Control (2015)  Target Ranges:  Prepandial:   less than 140 mg/dL      Peak postprandial:   less than 180 mg/dL (1-2 hours)      Critically ill patients:  140 - 180 mg/dL   Lab Results  Component Value Date   GLUCAP 172 (H) 10/20/2023    Review of Glycemic Control  Diabetes history: DM2 Outpatient Diabetes medications: None, diet-controlled Current orders for Inpatient glycemic control: None, received Novolog  6 units @ 0600 today  HgbA1C pending  Inpatient Diabetes Program Recommendations:    Consider adding Novolog  0-9 units Q4H  Continue to follow.  Thank you. Shona Brandy, RD, LDN, CDCES Inpatient Diabetes Coordinator [phone]

## 2023-10-20 NOTE — ED Provider Notes (Signed)
 Care of patient assumed from Dr. Geroldine.  Patient presenting for onset of chest pain last night.  ACS risk factors include positive family history, smoking, HTN, DM.  Workup thus far has been reassuring.  Awaiting second troponin. Physical Exam  BP 120/82   Pulse 72   Temp 98.1 F (36.7 C) (Oral)   Resp 16   Ht 5' 9 (1.753 m)   Wt 94.8 kg   SpO2 95%   BMI 30.86 kg/m   Physical Exam Vitals and nursing note reviewed.  Constitutional:      General: He is not in acute distress.    Appearance: He is well-developed. He is not ill-appearing, toxic-appearing or diaphoretic.  HENT:     Head: Normocephalic and atraumatic.  Eyes:     Conjunctiva/sclera: Conjunctivae normal.  Cardiovascular:     Rate and Rhythm: Normal rate and regular rhythm.  Pulmonary:     Effort: Pulmonary effort is normal. No tachypnea or respiratory distress.  Abdominal:     Palpations: Abdomen is soft.  Musculoskeletal:        General: No swelling. Normal range of motion.     Cervical back: Normal range of motion and neck supple.  Skin:    General: Skin is warm and dry.     Coloration: Skin is not cyanotic or pale.  Neurological:     General: No focal deficit present.     Mental Status: He is alert and oriented to person, place, and time.  Psychiatric:        Mood and Affect: Mood normal.        Behavior: Behavior normal.     Procedures  Procedures  ED Course / MDM    Medical Decision Making Amount and/or Complexity of Data Reviewed Labs: ordered. Radiology: ordered.  Risk OTC drugs. Prescription drug management. Decision regarding hospitalization.   Repeat troponin was mildly elevated.  On reassessment, patient endorses some ongoing chest discomfort.  In ambulance, he was given NTG but stated that it caused a burning sensation in his mouth.  Will trial NTG ointment.  As needed fentanyl  was ordered.  I spoke with cardiologist on-call, Dr. Alvan, who agrees with initiation of heparin .  Patient was  evaluated by Dr. Alvan at bedside.  Plan will be for admission at [facility] for cardiac cath.  Was admitted for further management.       Melvenia Motto, MD 10/20/23 [phone]

## 2023-10-20 NOTE — ED Provider Notes (Signed)
 [place] EMERGENCY DEPARTMENT AT [facility] Provider Note   CSN: [id number] Arrival date & time: 10/20/23  9681     Patient presents with: Chest Pain   Clayton Gallegos is a 58 y.o. male.  {Add pertinent medical, surgical, social history, OB history to YEP:67052} Patient is a 58 year old male presenting by EMS for evaluation of chest pain.  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  He denies any shortness of breath, nausea, or diaphoresis.  No recent exertional symptoms.  Risk factors include cigarette smoking, hypertension, and father with MI in his 43's.  He reports the pain he experienced is gone and reports only a pressure.       Prior to Admission medications   Medication Sig Start Date End Date Taking? Authorizing Provider  HYDROcodone -acetaminophen  (NORCO/VICODIN) 5-325 MG per tablet Take 1 tablet by mouth every 6 (six) hours as needed. 02/27/13   Charlyn Sora, MD  ibuprofen  (ADVIL ,MOTRIN ) 600 MG tablet Take 1 tablet (600 mg total) by mouth every 6 (six) hours as needed. 02/27/13   Charlyn Sora, MD  methocarbamol  (ROBAXIN ) 500 MG tablet Take 1 tablet (500 mg total) by mouth 2 (two) times daily. 02/27/13   Charlyn Sora, MD    Allergies: Augmentin [amoxicillin-pot clavulanate]    Review of Systems  All other systems reviewed and are negative.   Updated Vital Signs BP (!) 153/96   Pulse 82   Temp 98.1 F (36.7 C) (Oral)   Resp 15   SpO2 96%   Physical Exam Vitals and nursing note reviewed.  Constitutional:      General: He is not in acute distress.    Appearance: He is well-developed. He is not diaphoretic.  HENT:     Head: Normocephalic and atraumatic.  Cardiovascular:     Rate and Rhythm: Normal rate and regular rhythm.     Heart sounds: No murmur heard.    No friction rub.  Pulmonary:     Effort: Pulmonary effort is  normal. No respiratory distress.     Breath sounds: Normal breath sounds. No wheezing or rales.  Abdominal:     General: Bowel sounds are normal. There is no distension.     Palpations: Abdomen is soft.     Tenderness: There is no abdominal tenderness.  Musculoskeletal:        General: Normal range of motion.     Cervical back: Normal range of motion and neck supple.     Right lower leg: No tenderness. No edema.     Left lower leg: No tenderness. No edema.  Skin:    General: Skin is warm and dry.  Neurological:     Mental Status: He is alert and oriented to person, place, and time.     Coordination: Coordination normal.     (all labs ordered are listed, but only abnormal results are displayed) Labs Reviewed  CBC  BASIC METABOLIC PANEL WITH GFR  TROPONIN I (HIGH SENSITIVITY)    EKG: EKG Interpretation Date/Time:  Monday October 20 2023 03:27:23 EDT Ventricular Rate:  66 PR Interval:  131 QRS Duration:  107 QT Interval:  378 QTC Calculation: 396 R Axis:   57  Text Interpretation: Sinus rhythm Normal ECG Confirmed by Geroldine Berg ([id number]) on 10/20/2023 3:34:04 AM  Radiology: No results found.  {Document cardiac monitor, telemetry  assessment procedure when appropriate:32947} Procedures   Medications Ordered in the ED - No data to display    {Click here for ABCD2, HEART and other calculators REFRESH Note before signing:1}                              Medical Decision Making Amount and/or Complexity of Data Reviewed Labs: ordered. Radiology: ordered.   ***  {Document critical care time when appropriate  Document review of labs and clinical decision tools ie CHADS2VASC2, etc  Document your independent review of radiology images and any outside records  Document your discussion with family members, caretakers and with consultants  Document social determinants of health affecting pt's care  Document your decision making why or why not admission, treatments were  needed:32947:::1}   Final diagnoses:  None    ED Discharge Orders     None

## 2023-10-20 NOTE — Progress Notes (Signed)
   10/20/23 2107  BiPAP/CPAP/SIPAP  BiPAP/CPAP/SIPAP Pt Type Adult  BiPAP/CPAP/SIPAP Resmed  Mask Type Nasal mask  EPAP 10 cmH2O  Patient Home Machine No  Patient Home Mask No  Patient Home Tubing No  Auto Titrate No  BiPAP/CPAP /SiPAP Vitals  Pulse Rate 88  SpO2 97 %  Bilateral Breath Sounds Clear  MEWS Score/Color  MEWS Score 0  MEWS Score Color Landy

## 2023-10-20 NOTE — Progress Notes (Signed)
 PHARMACY - ANTICOAGULATION CONSULT NOTE  Pharmacy Consult for heparin  Indication: chest pain/ACS  Allergies  Allergen Reactions   Augmentin [Amoxicillin-Pot Clavulanate] Nausea And Vomiting    Patient Measurements:    Vital Signs: Temp: 98.1 F (36.7 C) (07/14 0330) Temp Source: Oral (07/14 0330) BP: 150/96 (07/14 0800) Pulse Rate: 77 (07/14 0800)  Labs: Recent Labs    10/20/23 0338 10/20/23 0656  HGB 14.5  --   HCT 41.1  --   PLT 207  --   CREATININE 1.11  --   TROPONINIHS 16 68*    CrCl cannot be calculated (Unknown ideal weight.).   Medical History: Past Medical History:  Diagnosis Date   Diabetes mellitus without complication (HCC)    Type II diet controlled   Gout    Hypertension    Sleep apnea     Medications:  (Not in a hospital admission)   Assessment: Pharmacy consulted to dose heparin  in patient with chest pain/ACS.  Patient is not on anticoagulation prior to admission.  CBC WNL Trop 68  Goal of Therapy:  Heparin  level 0.3-0.7 units/ml Monitor platelets by anticoagulation protocol: Yes   Plan:  Give 4000 units bolus x 1 Start heparin  infusion at 1100 units/hr Check anti-Xa level in 6 hours and daily while on heparin  Continue to monitor H&H and platelets  Elspeth Sour, PharmD Clinical Pharmacist 10/20/2023 10:14 AM

## 2023-10-20 NOTE — ED Notes (Signed)
 Attempted to call Cath Lab, no answer.

## 2023-10-20 NOTE — ED Notes (Signed)
 Contacted [place], PA-C for  clarification of normal saline orders for pre heart cath, verbal order to run normal saline at 284.78ml/ hr x 3 hours then run at 94.38ml per hour if pt is still in the emergency department.

## 2023-10-20 NOTE — ED Notes (Signed)
 Report given to [facility], RN @ Cath Lab

## 2023-10-20 NOTE — Interval H&P Note (Signed)
 History and Physical Interval Note:  10/20/2023 4:24 PM  Clayton Gallegos  has presented today for surgery, with the diagnosis of NSTEMI.  The various methods of treatment have been discussed with the patient and family. After consideration of risks, benefits and other options for treatment, the patient has consented to  Procedure(s): LEFT HEART CATH AND CORONARY ANGIOGRAPHY (N/A) as a surgical intervention.  The patient's history has been reviewed, patient examined, no change in status, stable for surgery.  I have reviewed the patient's chart and labs.  Questions were answered to the patient's satisfaction.    Cath Lab Visit (complete for each Cath Lab visit)  Clinical Evaluation Leading to the Procedure:   ACS: Yes.    Non-ACS:  N/A  [place]

## 2023-10-20 NOTE — ED Notes (Signed)
 Patient transported to CT

## 2023-10-20 NOTE — ED Triage Notes (Signed)
 Pt arrived via [facility] c/o CP 7/10 states it feels like pressure, EMS gave 325 mg of aspirin  en route and attempted SL nitroglycerin  but pt stated it burned his mouth so he spit it out. Denies shob

## 2023-10-20 NOTE — H&P (Signed)
 Cardiology Admission History and Physical   Patient ID: Clayton Gallegos MRN: [id number]; DOB: November 27, 1965   Admission date: 10/20/2023  PCP:  Honora Darice RIGGERS   [facility] Providers Cardiologist:  None   Chief Complaint:  Chest pain  Patient Profile: Clayton Gallegos is a 58 y.o. male with HTN, DM2, tobacco abuse,  who is being seen 10/20/2023 for the evaluation of ches tpain.  History of Present Illness: Clayton Gallegos 58 yo male history of tobacco abuse presents with chest pain. 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 Around 1AM onset of initially left shoulder pain, that then spready to left chest and into left neck. 6-8/10 deep pain. No associated SOB, but marked significant fatigue that came on. EMS called and brought to ER. In ER esclation of pain to 9-10/10 in severity. Currently pain free.    K 4.1 Cr 1.11 BUN 17  Trop 16-->68 EKG: SR, no ischemic changes CXR: no acute process CTA: no acute aortic fidings  Past Medical History:  Diagnosis Date   Diabetes mellitus without complication (HCC)    Type II diet controlled   Gout    Hypertension    Sleep apnea    Past Surgical History:  Procedure Laterality Date   CHOLECYSTECTOMY       Medications Prior to Admission: Prior to Admission medications   Medication Sig Start Date End Date Taking? Authorizing Provider  HYDROcodone -acetaminophen  (NORCO/VICODIN) 5-325 MG per tablet Take 1 tablet by mouth every 6 (six) hours as needed. 02/27/13   Charlyn Sora, MD  ibuprofen  (ADVIL ,MOTRIN ) 600 MG tablet Take 1 tablet (600 mg total) by mouth every 6 (six) hours as needed. 02/27/13   Charlyn Sora, MD  methocarbamol  (ROBAXIN ) 500 MG tablet Take 1 tablet (500 mg total) by mouth 2 (two) times daily. 02/27/13   Charlyn Sora, MD     Allergies:    Allergies  Allergen Reactions   Augmentin [Amoxicillin-Pot Clavulanate] Nausea And Vomiting    Social History:   Social History   Socioeconomic History   Marital  status: Married    Spouse name: Not on file   Number of children: Not on file   Years of education: Not on file   Highest education level: Not on file  Occupational History   Not on file  Tobacco Use   Smoking status: Every Day    Current packs/day: 0.50    Types: Cigarettes   Smokeless tobacco: Never  Vaping Use   Vaping status: Never Used  Substance and Sexual Activity   Alcohol use: No   Drug use: No   Sexual activity: Not on file  Other Topics Concern   Not on file  Social History Narrative   Not on file   Social Drivers of Health   Financial Resource Strain: Not on file  Food Insecurity: Not on file  Transportation Needs: Not on file  Physical Activity: Not on file  Stress: Not on file  Social Connections: Not on file  Intimate Partner Violence: Not on file     Family History:   The patient's family history is not on file.    ROS:  Please see the history of present illness.  All other ROS reviewed and negative.     Physical Exam/Data: Vitals:   10/20/23 0515 10/20/23 0530 10/20/23 0640 10/20/23 0800  BP: 136/87 117/66 (!) 134/97 (!) 150/96  Pulse: 79 81 79 77  Resp: 16 17 18    Temp:      TempSrc:  SpO2: 97% 96% 96% 97%    Intake/Output Summary (Last 24 hours) at 10/20/2023 0932 Last data filed at 10/20/2023 0854 Gross per 24 hour  Intake 1050 ml  Output --  Net 1050 ml      03/09/2019    7:04 AM 02/27/2013    6:39 AM  Last 3 Weights  Weight (lbs) 250 lb 283 lb  Weight (kg) 113.399 kg 128.368 kg     There is no height or weight on file to calculate BMI.  General:  Well nourished, well developed, in no acute distress HEENT: normal Neck: no JVD Vascular: No carotid bruits; Distal pulses 2+ bilaterally   Cardiac:  normal S1, S2; RRR; no murmur  Lungs:  clear to auscultation bilaterally, no wheezing, rhonchi or rales  Abd: soft, nontender, no hepatomegaly  Ext: no edema Musculoskeletal:  No deformities, BUE and BLE strength normal and  equal Skin: warm and dry  Neuro:  CNs 2-12 intact, no focal abnormalities noted Psych:  Normal affect     Laboratory Data: High Sensitivity Troponin:   Recent Labs  Lab 10/20/23 0338 10/20/23 0656  TROPONINIHS 16 68*      Chemistry Recent Labs  Lab 10/20/23 0338  NA 130*  K 4.1  CL 97*  CO2 21*  GLUCOSE 404*  BUN 17  CREATININE 1.11  CALCIUM  8.6*  GFRNONAA >60  ANIONGAP 12    No results for input(s): PROT, ALBUMIN, AST, ALT, ALKPHOS, BILITOT in the last 168 hours. Lipids No results for input(s): CHOL, TRIG, HDL, LABVLDL, LDLCALC, CHOLHDL in the last 168 hours. Hematology Recent Labs  Lab 10/20/23 0338  WBC 8.9  RBC 4.35  HGB 14.5  HCT 41.1  MCV 94.5  MCH 33.3  MCHC 35.3  RDW 14.6  PLT 207   Thyroid No results for input(s): TSH, FREET4 in the last 168 hours. BNPNo results for input(s): BNP, PROBNP in the last 168 hours.  DDimer No results for input(s): DDIMER in the last 168 hours.  Radiology/Studies:  CT Angio Chest/Abd/Pel for Dissection W and/or Wo Contrast Result Date: 10/20/2023 CLINICAL DATA:  Chest pain and pressure concerning for acute aortic syndrome. EXAM: CT ANGIOGRAPHY CHEST, ABDOMEN AND PELVIS TECHNIQUE: Noncontrast CT of the chest was initially obtained. Multidetector CT imaging through the chest, abdomen and pelvis was performed using the standard protocol during bolus administration of intravenous contrast. Multiplanar reconstructed images and MIPs were obtained and reviewed to evaluate the vascular anatomy. RADIATION DOSE REDUCTION: This exam was performed according to the departmental dose-optimization program which includes automated exposure control, adjustment of the mA and/or kV according to patient size and/or use of iterative reconstruction technique. CONTRAST:  OMNIPAQUE  IOHEXOL  350 MG/ML SOLN COMPARISON:  PA and lateral chest today, portable chest 02/27/2013. No prior cross-sectional imaging for  comparison. FINDINGS: CTA CHEST FINDINGS Cardiovascular: The cardiac size is normal. Pulmonary arteries opacify greater than the aorta and branches. No arterial dilatation or embolus is seen. The pulmonary veins are nondilated. There is mild scattered calcific plaque in the descending aorta. No great vessel disease is seen. There is a normal variant 4 vessel aortic arch with the left vertebral artery arising from the arch. There is no aortic or great vessel aneurysm, stenosis or dissection. No pericardial effusion. Minimal 2 vessel scattered calcific plaques noted LAD and circumflex coronary arteries. Mediastinum/Nodes: Evidence of prior left hemithyroidectomy. The right lobe and isthmus are unremarkable. Axillary spaces are clear. There is no intrathoracic adenopathy. Negative appearance thoracic trachea, thoracic esophagus. Lungs/Pleura:  Diffuse bronchial thickening. No bronchiectasis. No visible bronchial impactions. Mild elevation right hemidiaphragm there is no consolidation, effusion, pneumothorax or visible pulmonary nodule. Musculoskeletal: Mild kyphosis and degenerative change thoracic spine. No acute or significant osseous findings. Unremarkable chest wall. Review of the MIP images confirms the above findings. CTA ABDOMEN AND PELVIS FINDINGS VASCULAR Aorta: There are moderate patchy mixed plaques without aneurysm, dissection or stenosis in the infrarenal segment. The suprarenal segment is relatively normal. Some of the soft plaque in the infrarenal segment does appear ulcerative but there are no penetrating atherosclerotic ulcers. Celiac: Widely patent. No aneurysm, dissection or stenosis. There are scattered nonstenosing calcifications in the splenic artery. There are no Roseanne Juenger occlusions. SMA: Normal. Renals: Both are single. The right renal artery is normal. On the left, there is an 8 mm in length segment of the proximal renal artery showing up to 50% stenosis due to soft plaque along the superior wall.  The rest is widely patent. No hilar Meiko Stranahan occlusion is seen either side. IMA: Patent without evidence of aneurysm, dissection, vasculitis or significant stenosis. Inflow: Patent without evidence of aneurysm, dissection, vasculitis or significant stenosis. There are patchy nonstenosing calcific plaques in the common iliac and internal iliac arteries. The external iliac arteries are plaque free. Veins: No obvious venous abnormality within the limitations of this arterial phase study. Normal variant circumaortic left renal vein. Review of the MIP images confirms the above findings. NON-VASCULAR Hepatobiliary: The liver is 22 cm length with moderate steatosis. There is no mass enhancement. Gallbladder is absent without biliary dilatation. There is a prominent hepatic portal vein measuring 15 mm. Pancreas: No abnormality. Spleen: Enlarged measuring 15.3 cm in length.  No focal abnormality. Adrenals/Urinary Tract: No abnormality. Stomach/Bowel: There are moderate thickened folds in the stomach. Probable gastritis or peptic ulcer disease. Endoscopy may be indicated but no penetrating ulcer is seen. The unopacified small bowel is unremarkable. The appendix extends medially from the cecum with the tip in between the mid common iliac arteries and is unremarkable. There is colonic diverticulosis without evidence of diverticulitis or colitis. Lymphatic: There is a single slightly prominent portocaval lymph node measuring 1 cm in short axis on 5:104. No further adenopathy. Reproductive: Normal prostate size. Both testicles are in the scrotal sac, the right not fully seen. Other: Small inguinal fat hernias. No incarcerated hernia. No free fluid or focal inflammatory process. Musculoskeletal: Transitional anatomy lumbosacral junction. Mild degenerative change lumbar spine. No acute or significant osseous findings. Review of the MIP images confirms the above findings. IMPRESSION: 1. No evidence of aortic aneurysm, flow-limiting  stenosis, or dissection. 2. Moderate mixed plaques in the infrarenal aorta with some of the soft plaque appearing ulcerative but no penetrating atherosclerotic ulcers. 3. 50% stenosis in the proximal left renal artery due to soft plaque. 4. Minimal 2 vessel coronary artery atherosclerosis. 5. Diffuse bronchial thickening without bronchiectasis, infiltrates or bronchial impactions. 6. Hepatosplenomegaly with moderate hepatic steatosis. 7. Prominent hepatic portal vein and single slightly prominent portocaval lymph node. 8. Moderate thickened folds in the stomach, probable gastritis or peptic ulcer disease. Endoscopy may be indicated. 9. Diverticulosis without evidence of diverticulitis. 10. Small inguinal fat hernias. Aortic Atherosclerosis (ICD10-I70.0). Electronically Signed   By: Francis Quam M.D.   On: 10/20/2023 06:26   DG Chest 2 View Result Date: 10/20/2023 EXAM: 2 VIEW(S) XRAY OF THE CHEST 10/20/2023 04:32:00 AM COMPARISON: 02/27/2013 CLINICAL HISTORY: Chest pain. Per triage CP 7/10 states it feels like pressure, EMS gave 325 mg of aspirin  en route  and attempted SL nitroglycerin  but pt stated it burned his mouth so he spit it out. FINDINGS: LUNGS AND PLEURA: No focal pulmonary opacity. No pulmonary edema. No pleural effusion. No pneumothorax. HEART AND MEDIASTINUM: No acute abnormality of the cardiac and mediastinal silhouettes. BONES AND SOFT TISSUES: No acute osseous abnormality. IMPRESSION: 1. No acute process. Electronically signed by: Lonni Necessary MD 10/20/2023 04:38 AM EDT RP Workstation: [id number]     Assessment and Plan:  1.NSTEMI - mulptiple CAD risk factors including tobacco, DM2, HTN, family history father MIs in his 10s. Also CAD noted on CTA.  - mild trop but positive delta, EKG without acute ischemic changes - CTA no acute aortic findings but shows 2 vessel CAD  - given risk factors, symptoms, positive delta trop plan for cath. Will see if room for today, if not tomorrow  would be sufficent, would plan on transfer to [facility] independent of cath timing. - medical therapy with ASA, hep gtt. Start atorva 80mg  daily, lopressor  12.5mg  bid. Can add ACE/ARB post cath if CAD confirmed.   Informed Consent   Shared Decision Making/Informed Consent The risks [stroke (1 in 1000), death (1 in 1000), kidney failure [usually temporary] (1 in 500), bleeding (1 in 200), allergic reaction [possibly serious] (1 in 200)], benefits (diagnostic support and management of coronary artery disease) and alternatives of a cardiac catheterization were discussed in detail with Mr. Brandon and he is willing to proceed.      2. Possible gastritis - thickened folds on CT, probable gastritis - start protonix  40mg  daily  3. Renal artery stenosis - noted on CTA, 50% left renal artery stenosis - normal renal function, fairly benign HTN.  - initiate medical therapy.   4. DM2 - per history, has not been on meds at home, reportedly diet controlle - add SSI, glucose checks.   Risk Assessment/Risk Scores:  TIMI Risk Score for Unstable Angina or Non-ST Elevation MI:   The patient's TIMI risk score is 3, which indicates a 13% risk of all cause mortality, new or recurrent myocardial infarction or need for urgent revascularization in the next 14 days.{   Severity of Illness: The appropriate patient status for this patient is INPATIENT. Inpatient status is judged to be reasonable and necessary in order to provide the required intensity of service to ensure the patient's safety. The patient's presenting symptoms, physical exam findings, and initial radiographic and laboratory data in the context of their chronic comorbidities is felt to place them at high risk for further clinical deterioration. Furthermore, it is not anticipated that the patient will be medically stable for discharge from the hospital within 2 midnights of admission.   * I certify that at the point of admission it is my clinical judgment  that the patient will require inpatient hospital care spanning beyond 2 midnights from the point of admission due to high intensity of service, high risk for further deterioration and high frequency of surveillance required.*  For questions or updates, please contact [facility] Please consult [email or web address] for contact info under     Signed, [name], MD  10/20/2023 9:32 AM

## 2023-10-21 ENCOUNTER — Other Ambulatory Visit (HOSPITAL_COMMUNITY): Payer: Self-pay

## 2023-10-21 ENCOUNTER — Telehealth (HOSPITAL_COMMUNITY): Payer: Self-pay | Admitting: Pharmacy Technician

## 2023-10-21 ENCOUNTER — Inpatient Hospital Stay (HOSPITAL_COMMUNITY)

## 2023-10-21 ENCOUNTER — Encounter (HOSPITAL_COMMUNITY): Payer: Self-pay | Admitting: Internal Medicine

## 2023-10-21 DIAGNOSIS — I255 Ischemic cardiomyopathy: Secondary | ICD-10-CM | POA: Insufficient documentation

## 2023-10-21 DIAGNOSIS — E118 Type 2 diabetes mellitus with unspecified complications: Secondary | ICD-10-CM | POA: Insufficient documentation

## 2023-10-21 DIAGNOSIS — I214 Non-ST elevation (NSTEMI) myocardial infarction: Secondary | ICD-10-CM | POA: Diagnosis not present

## 2023-10-21 DIAGNOSIS — E785 Hyperlipidemia, unspecified: Secondary | ICD-10-CM | POA: Insufficient documentation

## 2023-10-21 DIAGNOSIS — I701 Atherosclerosis of renal artery: Secondary | ICD-10-CM | POA: Insufficient documentation

## 2023-10-21 LAB — HEMOGLOBIN A1C
Hgb A1c MFr Bld: 7.7 % — ABNORMAL HIGH (ref 4.8–5.6)
Mean Plasma Glucose: 174.29 mg/dL

## 2023-10-21 LAB — GLUCOSE, CAPILLARY
Glucose-Capillary: 224 mg/dL — ABNORMAL HIGH (ref 70–99)
Glucose-Capillary: 190 mg/dL — ABNORMAL HIGH (ref 70–99)

## 2023-10-21 LAB — ECHOCARDIOGRAM COMPLETE
Area-P 1/2: 3.77 cm2
Calc EF: 59.4 %
Height: 69 in
S' Lateral: 4 cm
Single Plane A2C EF: 62.1 %
Single Plane A4C EF: 59.6 %
Weight: 3344 [oz_av]

## 2023-10-21 LAB — BASIC METABOLIC PANEL WITH GFR
Anion gap: 10 (ref 5–15)
BUN: 11 mg/dL (ref 6–20)
CO2: 26 mmol/L (ref 22–32)
Calcium: 9.2 mg/dL (ref 8.9–10.3)
Chloride: 101 mmol/L (ref 98–111)
Creatinine, Ser: 1.06 mg/dL (ref 0.61–1.24)
GFR, Estimated: >60 mL/min (ref >60)
Glucose, Bld: 197 mg/dL — ABNORMAL HIGH (ref 70–99)
Potassium: 4.2 mmol/L (ref 3.5–5.1)
Sodium: 137 mmol/L (ref 135–145)

## 2023-10-21 LAB — LIPID PANEL
Cholesterol: 136 mg/dL (ref 0–200)
HDL: 26 mg/dL — ABNORMAL LOW (ref >40)
LDL Cholesterol: 64 mg/dL (ref 0–99)
Total CHOL/HDL Ratio: 5.2 ratio
Triglycerides: 229 mg/dL — ABNORMAL HIGH (ref <150)
VLDL: 46 mg/dL — ABNORMAL HIGH (ref 0–40)

## 2023-10-21 LAB — CBC
HCT: 40.3 % (ref 39.0–52.0)
Hemoglobin: 14.3 g/dL (ref 13.0–17.0)
MCH: 33.2 pg (ref 26.0–34.0)
MCHC: 35.5 g/dL (ref 30.0–36.0)
MCV: 93.5 fL (ref 80.0–100.0)
Platelets: 169 K/uL (ref 150–400)
RBC: 4.31 MIL/uL (ref 4.22–5.81)
RDW: 14.7 % (ref 11.5–15.5)
WBC: 7.3 K/uL (ref 4.0–10.5)
nRBC: 0 % (ref 0.0–0.2)

## 2023-10-21 MED ORDER — METOPROLOL SUCCINATE ER 25 MG PO TB24
25.0000 mg | ORAL_TABLET | Freq: Every day | ORAL | 1 refills | Status: DC
  Filled 2023-10-21: qty 90, 90d supply, fill #0

## 2023-10-21 MED ORDER — LORAZEPAM 1 MG PO TABS
1.0000 mg | ORAL_TABLET | Freq: Once | ORAL | Status: AC
  Administered 2023-10-21: 1 mg via ORAL
  Filled 2023-10-21: qty 1

## 2023-10-21 MED ORDER — NITROGLYCERIN 0.4 MG SL SUBL
0.4000 mg | SUBLINGUAL_TABLET | SUBLINGUAL | 2 refills | Status: DC | PRN
  Filled 2023-10-21: qty 25, 8d supply, fill #0

## 2023-10-21 MED ORDER — ATORVASTATIN CALCIUM 80 MG PO TABS
80.0000 mg | ORAL_TABLET | Freq: Every day | ORAL | 1 refills | Status: DC
  Filled 2023-10-21: qty 90, 90d supply, fill #0

## 2023-10-21 MED ORDER — METFORMIN HCL ER 500 MG PO TB24
500.0000 mg | ORAL_TABLET | Freq: Every day | ORAL | 2 refills | Status: AC
  Filled 2023-10-21: qty 30, 30d supply, fill #0

## 2023-10-21 MED ORDER — METFORMIN HCL 500 MG PO TABS
500.0000 mg | ORAL_TABLET | Freq: Every day | ORAL | 1 refills | Status: DC
  Filled 2023-10-21: qty 30, 30d supply, fill #0

## 2023-10-21 MED ORDER — METOPROLOL TARTRATE 25 MG PO TABS
12.5000 mg | ORAL_TABLET | Freq: Two times a day (BID) | ORAL | 2 refills | Status: DC
  Filled 2023-10-21: qty 30, 30d supply, fill #0

## 2023-10-21 MED ORDER — TICAGRELOR 90 MG PO TABS
90.0000 mg | ORAL_TABLET | Freq: Two times a day (BID) | ORAL | 2 refills | Status: DC
  Filled 2023-10-21: qty 60, 30d supply, fill #0

## 2023-10-21 MED ORDER — ASPIRIN 81 MG PO TBEC
81.0000 mg | DELAYED_RELEASE_TABLET | Freq: Every day | ORAL | 2 refills | Status: AC
  Filled 2023-10-21: qty 90, 90d supply, fill #0

## 2023-10-21 NOTE — Telephone Encounter (Signed)
 Patient Product/process development scientist completed.    The patient is insured through [employer] [place] . Patient has [employer], may use a copay card, and/or apply for patient assistance if available.    Ran test claim for ticagrelor  (Brilinta ) 90 mg and the current 30 day co-pay is $27.25.   This test claim was processed through [facility]- copay amounts may vary at other pharmacies due to pharmacy/plan contracts, or as the patient moves through the different stages of their insurance plan.     Reyes Sharps, CPHT Pharmacy Technician III Certified Patient Advocate [facility] Patient Advocate Team Direct Number: [phone]  Fax: [phone]

## 2023-10-21 NOTE — Progress Notes (Signed)
 Discussed with pt MI, stent, Brilinta  importance, restrictions, smoking cessation, diet, exercise, NTG, and CRPII. Pt receptive with many questions. He is motivated to quit smoking. Resources given. Will refer to [facility].   Sent pt walking hall, no c/o. BP has been elevated.  [id number] Clayton Gallegos BS, ACSM-CEP 10/21/2023 8:40 AM

## 2023-10-21 NOTE — Discharge Summary (Signed)
 Discharge Summary   Patient ID: Clayton Gallegos MRN: [id number]; DOB: 08/20/1965  Admit date: 10/20/2023 Discharge date: 10/21/2023  PCP:  [name], PA-C   [facility] Providers Cardiologist:  [name], MD      Discharge Diagnoses  Principal Problem:   NSTEMI (non-ST elevated myocardial infarction) [facility]) Active Problems:   Hyperlipidemia   Ischemic cardiomyopathy   Type 2 diabetes mellitus with complication, without long-term current use of insulin  [facility])   Renal artery stenosis [facility])  Diagnostic Studies/Procedures   Cath: 10/20/2023  Conclusions: Significant two-vessel coronary artery disease, including subacute versus chronic total occlusion of distal RCA with left-to-right collaterals as well as 95% stenosis of large OM1 branch.  Moderate mid LAD and distal LCx stenoses are not hemodynamically significant (RFR = 0.91 of both lesions). Mildly reduced left ventricular systolic function (LVEF 45-50%) with subtle mid/apical anterior/anterolateral hypokinesis. Mildly elevated left ventricular filling pressure (LVEDP 20 mmHg). Successful PCI of OM1 using Synergy 2.25 x 16 mm drug-eluting stent (post-dilated to 2.6 mm) with 0% residual stenosis and TIMI-3 flow.   Recommendations: Dual antiplatelet therapy with aspirin  and ticagrelor  for at least 12 months. Aggressive secondary prevention of coronary artery disease.   Lonni Hanson, MD [facility]  Diagnostic Dominance: Right  Intervention    Echo: 10/21/2023  IMPRESSIONS     1. Left ventricular ejection fraction, by estimation, is 40 to 45%. The  left ventricle has mildly decreased function. The left ventricle  demonstrates regional wall motion abnormalities (see scoring  diagram/findings for description). Left ventricular  diastolic parameters are consistent with Grade II diastolic dysfunction  (pseudonormalization). Elevated left atrial pressure. There is mild  hypokinesis of the left  ventricular, basal-mid inferoseptal wall and  inferior wall. There is moderate hypokinesis  of the left ventricular, basal-mid anterolateral wall.   2. Right ventricular systolic function is normal. The right ventricular  size is normal. Tricuspid regurgitation signal is inadequate for assessing  PA pressure.   3. The mitral valve is normal in structure. No evidence of mitral valve  regurgitation. No evidence of mitral stenosis.   4. The aortic valve is tricuspid. There is mild calcification of the  aortic valve. Aortic valve regurgitation is not visualized. Aortic valve  sclerosis/calcification is present, without any evidence of aortic  stenosis.   5. The inferior vena cava is normal in size with greater than 50%  respiratory variability, suggesting right atrial pressure of 3 mmHg.   FINDINGS   Left Ventricle: Left ventricular ejection fraction, by estimation, is 40  to 45%. The left ventricle has mildly decreased function. The left  ventricle demonstrates regional wall motion abnormalities. Mild  hypokinesis of the left ventricular, basal-mid  inferoseptal wall and inferior wall. Moderate hypokinesis of the left  ventricular, basal-mid anterolateral wall. The left ventricular internal  cavity size was normal in size. There is no left ventricular hypertrophy.  Left ventricular diastolic parameters   are consistent with Grade II diastolic dysfunction (pseudonormalization).  Elevated left atrial pressure.     LV Wall Scoring:  The antero-lateral wall, inferior wall, and basal inferoseptal segment are  hypokinetic.   Right Ventricle: The right ventricular size is normal. No increase in  right ventricular wall thickness. Right ventricular systolic function is  normal. Tricuspid regurgitation signal is inadequate for assessing PA  pressure.   Left Atrium: Left atrial size was normal in size.   Right Atrium: Right atrial size was normal in size.   Pericardium: There is no  evidence of  pericardial effusion.   Mitral Valve: The mitral valve is normal in structure. No evidence of  mitral valve regurgitation. No evidence of mitral valve stenosis.   Tricuspid Valve: The tricuspid valve is normal in structure. Tricuspid  valve regurgitation is not demonstrated.   Aortic Valve: The aortic valve is tricuspid. There is mild calcification  of the aortic valve. Aortic valve regurgitation is not visualized. Aortic  valve sclerosis/calcification is present, without any evidence of aortic  stenosis.   Pulmonic Valve: The pulmonic valve was grossly normal. Pulmonic valve  regurgitation is not visualized. No evidence of pulmonic stenosis.   Aorta: The aortic root and ascending aorta are structurally normal, with  no evidence of dilitation.   Venous: The inferior vena cava is normal in size with greater than 50%  respiratory variability, suggesting right atrial pressure of 3 mmHg.   IAS/Shunts: No atrial level shunt detected by color flow Doppler.   _____________   History of Present Illness   Clayton Gallegos is a 58 y.o. male with past medical history of hypertension, diabetes, tobacco abuse who presented to the [facility] ED on 7/14 with complaints of chest pain.  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  In the ED he was noted to have elevated troponin from 16>>68.  He was admitted to cardiology for further evaluation and transferred to [facility] with plans for cardiac catheterization.  Hospital Course    NSTEMI -- Underwent cardiac catheterization with significant two-vessel CAD with total occlusion of RCA with left-to-right collaterals as well as 95% stenosis of large OM1 branch with PCI/DES x 1.  Did have moderate mid LAD and distal circumflex stenosis which were not hemodynamically significant with RFR of 0.91 in both lesions.   Recommendations for DAPT with aspirin /Brilinta  for at least 1 year.  Seen by cardiac rehab. -- Continue aspirin , Brilinta , atorvastatin  80 mg daily  Ischemic cardiomyopathy -- Echocardiogram with LVEF of 40 to 45%, grade 2 diastolic dysfunction, hypokinesis of the left ventricular, basal mid inferior septal wall with moderate hypokinesis of the basal mid anterior lateral wall, normal RV function, no significant valvular disease. -- GDMT: Continue Toprol -XL 25 mg daily, lisinopril 40 mg daily.  Consider addition of spironolactone as an outpatient at follow-up  Hyperlipidemia -- LDL 64, HDL 26 -- Continue atorvastatin  80 mg daily -- Will need LFT/FLP in 8 weeks  Renal artery stenosis -- Noted on CT with 50% left renal artery stenosis, normal renal function -- As above continue lisinopril 40 mg daily, metoprolol  XL 25 mg daily  Diabetes -- Hgb A1c 7.7 -- Started on metformin  500 mg extended release daily -- follow up with PCP regarding on-going management   Did the patient have an acute coronary syndrome (MI, NSTEMI, STEMI, etc) this admission?:  Yes                               AHA/ACC ACS Clinical Performance & Quality Measures: Aspirin  prescribed? - Yes ADP Receptor Inhibitor (Plavix/Clopidogrel, Brilinta /Ticagrelor  or Effient/Prasugrel) prescribed (includes medically managed patients)? - Yes Beta Blocker prescribed? - Yes High Intensity Statin (Lipitor 40-80mg  or Crestor 20-40mg ) prescribed? - Yes EF assessed during THIS hospitalization? - Yes For EF <40%, was ACEI/ARB prescribed? - Not Applicable (EF >/= 40%) For EF <40%, Aldosterone Antagonist (Spironolactone or Eplerenone) prescribed? -  Not Applicable (EF >/= 40%) Cardiac Rehab Phase II ordered (including medically managed patients)? - Yes   The patient will be scheduled for a TOC follow up appointment in 10-14 days.  A message has been sent to the [facility] and Scheduling Pool at the office where the patient should be seen for  follow up.  _____________  Discharge Vitals Blood pressure (!) 146/83, pulse 90, temperature 98.3 F (36.8 C), temperature source Oral, resp. rate 18, height 5' 9 (1.753 m), weight 94.8 kg, SpO2 98%.  Filed Weights   10/20/23 1011  Weight: 94.8 kg    Labs & Radiologic Studies  CBC Recent Labs    10/20/23 0338 10/21/23 0522  WBC 8.9 7.3  HGB 14.5 14.3  HCT 41.1 40.3  MCV 94.5 93.5  PLT 207 169   Basic Metabolic Panel Recent Labs    92/85/74 0338 10/21/23 0522  NA 130* 137  K 4.1 4.2  CL 97* 101  CO2 21* 26  GLUCOSE 404* 197*  BUN 17 11  CREATININE 1.11 1.06  CALCIUM  8.6* 9.2   Liver Function Tests No results for input(s): AST, ALT, ALKPHOS, BILITOT, PROT, ALBUMIN in the last 72 hours. No results for input(s): LIPASE, AMYLASE in the last 72 hours. High Sensitivity Troponin:   Recent Labs  Lab 10/20/23 0338 10/20/23 0656 10/20/23 0858  TROPONINIHS 16 68* 73*    No results for input(s): TRNPT in the last 720 hours.  BNP Invalid input(s): POCBNP No results for input(s): PROBNP in the last 72 hours.  No results for input(s): BNP in the last 72 hours.  D-Dimer No results for input(s): DDIMER in the last 72 hours. Hemoglobin A1C Recent Labs    10/21/23 0522  HGBA1C 7.7*   Fasting Lipid Panel Recent Labs    10/21/23 0522  CHOL 136  HDL 26*  LDLCALC 64  TRIG 770*  CHOLHDL 5.2   No results found for: LIPOA  Thyroid Function Tests No results for input(s): TSH, T4TOTAL, T3FREE, THYROIDAB in the last 72 hours.  Invalid input(s): FREET3 _____________  ECHOCARDIOGRAM COMPLETE Result Date: 10/21/2023    ECHOCARDIOGRAM REPORT   Patient Name:   Clayton Gallegos Date of Exam: 10/21/2023 Medical Rec #:  [id number]     Height:       69.0 in Accession #:    [id number]    Weight:       209.0 lb Date of Birth:  [birth year]     BSA:          2.105 m Patient Age:    58 years      BP:           147/73 mmHg Patient Gender: M              HR:           61 bpm. Exam Location:  Inpatient Procedure: 2D Echo, Cardiac Doppler and Color Doppler (Both Spectral and Color            Flow Doppler were utilized during procedure). Indications:    Acute myocardial infarction  History:        Patient has no prior history of Echocardiogram examinations.  Sonographer:    Philomena Daring Referring Phys: Deianira.Dane CHRISTOPHER END IMPRESSIONS  1. Left ventricular ejection fraction, by estimation, is 40 to 45%. The left ventricle has mildly decreased function. The left ventricle demonstrates regional wall motion abnormalities (see scoring diagram/findings for description). Left ventricular diastolic parameters are consistent with Grade  II diastolic dysfunction (pseudonormalization). Elevated left atrial pressure. There is mild hypokinesis of the left ventricular, basal-mid inferoseptal wall and inferior wall. There is moderate hypokinesis of the left ventricular, basal-mid anterolateral wall.  2. Right ventricular systolic function is normal. The right ventricular size is normal. Tricuspid regurgitation signal is inadequate for assessing PA pressure.  3. The mitral valve is normal in structure. No evidence of mitral valve regurgitation. No evidence of mitral stenosis.  4. The aortic valve is tricuspid. There is mild calcification of the aortic valve. Aortic valve regurgitation is not visualized. Aortic valve sclerosis/calcification is present, without any evidence of aortic stenosis.  5. The inferior vena cava is normal in size with greater than 50% respiratory variability, suggesting right atrial pressure of 3 mmHg. FINDINGS  Left Ventricle: Left ventricular ejection fraction, by estimation, is 40 to 45%. The left ventricle has mildly decreased function. The left ventricle demonstrates regional wall motion abnormalities. Mild hypokinesis of the left ventricular, basal-mid inferoseptal wall and inferior wall. Moderate hypokinesis of the left ventricular, basal-mid  anterolateral wall. The left ventricular internal cavity size was normal in size. There is no left ventricular hypertrophy. Left ventricular diastolic parameters  are consistent with Grade II diastolic dysfunction (pseudonormalization). Elevated left atrial pressure.  LV Wall Scoring: The antero-lateral wall, inferior wall, and basal inferoseptal segment are hypokinetic. Right Ventricle: The right ventricular size is normal. No increase in right ventricular wall thickness. Right ventricular systolic function is normal. Tricuspid regurgitation signal is inadequate for assessing PA pressure. Left Atrium: Left atrial size was normal in size. Right Atrium: Right atrial size was normal in size. Pericardium: There is no evidence of pericardial effusion. Mitral Valve: The mitral valve is normal in structure. No evidence of mitral valve regurgitation. No evidence of mitral valve stenosis. Tricuspid Valve: The tricuspid valve is normal in structure. Tricuspid valve regurgitation is not demonstrated. Aortic Valve: The aortic valve is tricuspid. There is mild calcification of the aortic valve. Aortic valve regurgitation is not visualized. Aortic valve sclerosis/calcification is present, without any evidence of aortic stenosis. Pulmonic Valve: The pulmonic valve was grossly normal. Pulmonic valve regurgitation is not visualized. No evidence of pulmonic stenosis. Aorta: The aortic root and ascending aorta are structurally normal, with no evidence of dilitation. Venous: The inferior vena cava is normal in size with greater than 50% respiratory variability, suggesting right atrial pressure of 3 mmHg. IAS/Shunts: No atrial level shunt detected by color flow Doppler.  LEFT VENTRICLE PLAX 2D LVIDd:         5.50 cm     Diastology LVIDs:         4.00 cm     LV e' medial:    7.51 cm/s LV PW:         1.00 cm     LV E/e' medial:  12.3 LV IVS:        1.10 cm     LV e' lateral:   8.70 cm/s LVOT diam:     2.00 cm     LV E/e' lateral: 10.6 LV  SV:         51 LV SV Index:   24 LVOT Area:     3.14 cm  LV Volumes (MOD) LV vol d, MOD A2C: 69.2 ml LV vol d, MOD A4C: 96.1 ml LV vol s, MOD A2C: 26.2 ml LV vol s, MOD A4C: 38.8 ml LV SV MOD A2C:     43.0 ml LV SV MOD A4C:     96.1  ml LV SV MOD BP:      48.5 ml RIGHT VENTRICLE             IVC RV S prime:     11.50 cm/s  IVC diam: 1.80 cm TAPSE (M-mode): 1.8 cm LEFT ATRIUM             Index        RIGHT ATRIUM           Index LA diam:        2.80 cm 1.33 cm/m   RA Area:     12.20 cm LA Vol (A2C):   42.5 ml 20.19 ml/m  RA Volume:   30.10 ml  14.30 ml/m LA Vol (A4C):   39.1 ml 18.57 ml/m LA Biplane Vol: 43.7 ml 20.76 ml/m  AORTIC VALVE LVOT Vmax:   85.90 cm/s LVOT Vmean:  56.600 cm/s LVOT VTI:    0.162 m  AORTA Ao Root diam: 2.60 cm Ao Asc diam:  3.30 cm MITRAL VALVE MV Area (PHT): 3.77 cm    SHUNTS MV Decel Time: 201 msec    Systemic VTI:  0.16 m MV E velocity: 92.20 cm/s  Systemic Diam: 2.00 cm MV A velocity: 68.70 cm/s MV E/A ratio:  1.34 Mihai Croitoru MD Electronically signed by Jerel Balding MD Signature Date/Time: 10/21/2023/2:14:52 PM    Final    CARDIAC CATHETERIZATION Result Date: 10/20/2023 Conclusions: Significant two-vessel coronary artery disease, including subacute versus chronic total occlusion of distal RCA with left-to-right collaterals as well as 95% stenosis of large OM1 branch.  Moderate mid LAD and distal LCx stenoses are not hemodynamically significant (RFR = 0.91 of both lesions). Mildly reduced left ventricular systolic function (LVEF 45-50%) with subtle mid/apical anterior/anterolateral hypokinesis. Mildly elevated left ventricular filling pressure (LVEDP 20 mmHg). Successful PCI of OM1 using Synergy 2.25 x 16 mm drug-eluting stent (post-dilated to 2.6 mm) with 0% residual stenosis and TIMI-3 flow. Recommendations: Dual antiplatelet therapy with aspirin  and ticagrelor  for at least 12 months. Aggressive secondary prevention of coronary artery disease. Lonni Hanson, MD [facility]  CT Angio Chest/Abd/Pel for Dissection W and/or Wo Contrast Result Date: 10/20/2023 CLINICAL DATA:  Chest pain and pressure concerning for acute aortic syndrome. EXAM: CT ANGIOGRAPHY CHEST, ABDOMEN AND PELVIS TECHNIQUE: Noncontrast CT of the chest was initially obtained. Multidetector CT imaging through the chest, abdomen and pelvis was performed using the standard protocol during bolus administration of intravenous contrast. Multiplanar reconstructed images and MIPs were obtained and reviewed to evaluate the vascular anatomy. RADIATION DOSE REDUCTION: This exam was performed according to the departmental dose-optimization program which includes automated exposure control, adjustment of the mA and/or kV according to patient size and/or use of iterative reconstruction technique. CONTRAST:  OMNIPAQUE  IOHEXOL  350 MG/ML SOLN COMPARISON:  PA and lateral chest today, portable chest 02/27/2013. No prior cross-sectional imaging for comparison. FINDINGS: CTA CHEST FINDINGS Cardiovascular: The cardiac size is normal. Pulmonary arteries opacify greater than the aorta and branches. No arterial dilatation or embolus is seen. The pulmonary veins are nondilated. There is mild scattered calcific plaque in the descending aorta. No great vessel disease is seen. There is a normal variant 4 vessel aortic arch with the left vertebral artery arising from the arch. There is no aortic or great vessel aneurysm, stenosis or dissection. No pericardial effusion. Minimal 2 vessel scattered calcific plaques noted LAD and circumflex coronary arteries. Mediastinum/Nodes: Evidence of prior left hemithyroidectomy. The right lobe and isthmus are unremarkable. Axillary spaces are clear. There  is no intrathoracic adenopathy. Negative appearance thoracic trachea, thoracic esophagus. Lungs/Pleura: Diffuse bronchial thickening. No bronchiectasis. No visible bronchial impactions. Mild elevation right hemidiaphragm there is no  consolidation, effusion, pneumothorax or visible pulmonary nodule. Musculoskeletal: Mild kyphosis and degenerative change thoracic spine. No acute or significant osseous findings. Unremarkable chest wall. Review of the MIP images confirms the above findings. CTA ABDOMEN AND PELVIS FINDINGS VASCULAR Aorta: There are moderate patchy mixed plaques without aneurysm, dissection or stenosis in the infrarenal segment. The suprarenal segment is relatively normal. Some of the soft plaque in the infrarenal segment does appear ulcerative but there are no penetrating atherosclerotic ulcers. Celiac: Widely patent. No aneurysm, dissection or stenosis. There are scattered nonstenosing calcifications in the splenic artery. There are no branch occlusions. SMA: Normal. Renals: Both are single. The right renal artery is normal. On the left, there is an 8 mm in length segment of the proximal renal artery showing up to 50% stenosis due to soft plaque along the superior wall. The rest is widely patent. No hilar branch occlusion is seen either side. IMA: Patent without evidence of aneurysm, dissection, vasculitis or significant stenosis. Inflow: Patent without evidence of aneurysm, dissection, vasculitis or significant stenosis. There are patchy nonstenosing calcific plaques in the common iliac and internal iliac arteries. The external iliac arteries are plaque free. Veins: No obvious venous abnormality within the limitations of this arterial phase study. Normal variant circumaortic left renal vein. Review of the MIP images confirms the above findings. NON-VASCULAR Hepatobiliary: The liver is 22 cm length with moderate steatosis. There is no mass enhancement. Gallbladder is absent without biliary dilatation. There is a prominent hepatic portal vein measuring 15 mm. Pancreas: No abnormality. Spleen: Enlarged measuring 15.3 cm in length.  No focal abnormality. Adrenals/Urinary Tract: No abnormality. Stomach/Bowel: There are moderate  thickened folds in the stomach. Probable gastritis or peptic ulcer disease. Endoscopy may be indicated but no penetrating ulcer is seen. The unopacified small bowel is unremarkable. The appendix extends medially from the cecum with the tip in between the mid common iliac arteries and is unremarkable. There is colonic diverticulosis without evidence of diverticulitis or colitis. Lymphatic: There is a single slightly prominent portocaval lymph node measuring 1 cm in short axis on 5:104. No further adenopathy. Reproductive: Normal prostate size. Both testicles are in the scrotal sac, the right not fully seen. Other: Small inguinal fat hernias. No incarcerated hernia. No free fluid or focal inflammatory process. Musculoskeletal: Transitional anatomy lumbosacral junction. Mild degenerative change lumbar spine. No acute or significant osseous findings. Review of the MIP images confirms the above findings. IMPRESSION: 1. No evidence of aortic aneurysm, flow-limiting stenosis, or dissection. 2. Moderate mixed plaques in the infrarenal aorta with some of the soft plaque appearing ulcerative but no penetrating atherosclerotic ulcers. 3. 50% stenosis in the proximal left renal artery due to soft plaque. 4. Minimal 2 vessel coronary artery atherosclerosis. 5. Diffuse bronchial thickening without bronchiectasis, infiltrates or bronchial impactions. 6. Hepatosplenomegaly with moderate hepatic steatosis. 7. Prominent hepatic portal vein and single slightly prominent portocaval lymph node. 8. Moderate thickened folds in the stomach, probable gastritis or peptic ulcer disease. Endoscopy may be indicated. 9. Diverticulosis without evidence of diverticulitis. 10. Small inguinal fat hernias. Aortic Atherosclerosis (ICD10-I70.0). Electronically Signed   By: Francis Quam M.D.   On: 10/20/2023 06:26   DG Chest 2 View Result Date: 10/20/2023 EXAM: 2 VIEW(S) XRAY OF THE CHEST 10/20/2023 04:32:00 AM COMPARISON: 02/27/2013 CLINICAL  HISTORY: Chest pain. Per triage CP 7/10 states  it feels like pressure, EMS gave 325 mg of aspirin  en route and attempted SL nitroglycerin  but pt stated it burned his mouth so he spit it out. FINDINGS: LUNGS AND PLEURA: No focal pulmonary opacity. No pulmonary edema. No pleural effusion. No pneumothorax. HEART AND MEDIASTINUM: No acute abnormality of the cardiac and mediastinal silhouettes. BONES AND SOFT TISSUES: No acute osseous abnormality. IMPRESSION: 1. No acute process. Electronically signed by: Lonni Necessary MD 10/20/2023 04:38 AM EDT RP Workstation: [id number]    Disposition Pt is being discharged home today in good condition.  Follow-up Plans & Appointments  Follow-up Information     [name], PA-C Follow up.   Specialty: Physician Assistant Why: please follow up in 4-6 weeks regarding on-going management of your diabetes Contact information: [address] [place] [address] [phone]                Discharge Instructions     Amb Referral to Cardiac Rehabilitation   Complete by: As directed    To [place]   Diagnosis:  Coronary Stents NSTEMI PTCA     After initial evaluation and assessments completed: Virtual Based Care may be provided alone or in conjunction with Phase 2 Cardiac Rehab based on patient barriers.: Yes   Intensive Cardiac Rehabilitation (ICR) [facility] location only OR Traditional Cardiac Rehabilitation (TCR) *If criteria for ICR are not met will enroll in TCR [facility] only): Yes   Call MD for:  difficulty breathing, headache or visual disturbances   Complete by: As directed    Call MD for:  persistant dizziness or light-headedness   Complete by: As directed    Call MD for:  redness, tenderness, or signs of infection (pain, swelling, redness, odor or green/yellow discharge around incision site)   Complete by: As directed    Diet - low sodium heart healthy   Complete by: As directed    Discharge instructions   Complete by: As directed     Radial Site Care Refer to this sheet in the next few weeks. These instructions provide you with information on caring for yourself after your procedure. Your caregiver may also give you more specific instructions. Your treatment has been planned according to current medical practices, but problems sometimes occur. Call your caregiver if you have any problems or questions after your procedure. HOME CARE INSTRUCTIONS You may shower the day after the procedure. Remove the bandage (dressing) and gently wash the site with plain soap and water. Gently pat the site dry.  Do not apply powder or lotion to the site.  Do not submerge the affected site in water for 3 to 5 days.  Inspect the site at least twice daily.  Do not flex or bend the affected arm for 24 hours.  No lifting over 5 pounds (2.3 kg) for 5 days after your procedure.  Do not drive home if you are discharged the same day of the procedure. Have someone else drive you.  You may drive 24 hours after the procedure unless otherwise instructed by your caregiver.  What to expect: Any bruising will usually fade within 1 to 2 weeks.  Blood that collects in the tissue (hematoma) may be painful to the touch. It should usually decrease in size and tenderness within 1 to 2 weeks.  SEEK IMMEDIATE MEDICAL CARE IF: You have unusual pain at the radial site.  You have redness, warmth, swelling, or pain at the radial site.  You have drainage (other than a small amount of blood on the  dressing).  You have chills.  You have a fever or persistent symptoms for more than 72 hours.  You have a fever and your symptoms suddenly get worse.  Your arm becomes pale, cool, tingly, or numb.  You have heavy bleeding from the site. Hold pressure on the site.   PLEASE DO NOT MISS ANY DOSES OF YOUR BRILINTA !!!!! Also keep a log of you blood pressures and bring back to your follow up appt. Please call the office with any questions.   Patients taking blood thinners  should generally stay away from medicines like ibuprofen , Advil , Motrin , naproxen, and Aleve due to risk of stomach bleeding. You may take Tylenol  as directed or talk to your primary doctor about alternatives.   PLEASE ENSURE THAT YOU DO NOT RUN OUT OF YOUR BRILINTA . This medication is very important to remain on for at least one year. IF you have issues obtaining this medication due to cost please CALL the office 3-5 business days prior to running out in order to prevent missing doses of this medication.   Increase activity slowly   Complete by: As directed        Discharge Medications Allergies as of 10/21/2023       Reactions   Augmentin [amoxicillin-pot Clavulanate] Nausea And Vomiting        Medication List     TAKE these medications    albuterol 1.25 MG/3ML nebulizer solution Commonly known as: ACCUNEB Take 1 ampule by nebulization every 6 (six) hours as needed for shortness of breath or wheezing.   albuterol 108 (90 Base) MCG/ACT inhaler Commonly known as: VENTOLIN HFA Inhale 2 puffs into the lungs every 6 (six) hours as needed for shortness of breath or wheezing.   aspirin  EC 81 MG tablet Take 1 tablet (81 mg total) by mouth daily. Swallow whole. Start taking on: October 22, 2023   atorvastatin  80 MG tablet Commonly known as: LIPITOR Take 1 tablet (80 mg total) by mouth daily. Start taking on: October 22, 2023   lisinopril 40 MG tablet Commonly known as: ZESTRIL Take 40 mg by mouth daily.   metFORMIN  500 MG 24 hr tablet Commonly known as: GLUCOPHAGE -XR Take 1 tablet (500 mg total) by mouth daily with breakfast.   methotrexate 2.5 MG tablet Commonly known as: RHEUMATREX Take 15 mg by mouth once a week.   metoprolol  succinate 25 MG 24 hr tablet Commonly known as: Toprol  XL Take 1 tablet (25 mg total) by mouth daily.   nitroGLYCERIN  0.4 MG SL tablet Commonly known as: NITROSTAT  Place 1 tablet (0.4 mg total) under the tongue every 5 (five) minutes x 3 doses as  needed for chest pain.   sertraline  50 MG tablet Commonly known as: ZOLOFT  Take 50 mg by mouth daily.   ticagrelor  90 MG Tabs tablet Commonly known as: BRILINTA  Take 1 tablet (90 mg total) by mouth 2 (two) times daily.         Outstanding Labs/Studies  BMET at follow up FLP/LFTs in 8 weeks   Duration of Discharge Encounter: APP Time: 25 minutes   Signed, Manuelita Rummer, NP 10/21/2023, 2:45 PM

## 2023-10-21 NOTE — Progress Notes (Signed)
 DISCHARGE NOTE HOME GUNNARD DORRANCE to be discharged Home per MD order. Discussed prescriptions and follow up appointments with the patient. Prescriptions given to patient; medication list explained in detail. Patient verbalized understanding.  Skin clean, dry and intact without evidence of skin break down, no evidence of skin tears noted. IV catheter discontinued intact. Site without signs and symptoms of complications. Dressing and pressure applied. Pt denies pain at the site currently. No complaints noted.  Patient free of lines, drains, and wounds.   An After Visit Summary (AVS) was printed and given to the patient. Patient escorted via wheelchair, and discharged home via private auto.  Athleen Feltner K Mellany Dinsmore, RN

## 2023-10-22 ENCOUNTER — Encounter: Payer: Self-pay | Admitting: Cardiology

## 2023-10-22 ENCOUNTER — Telehealth: Payer: Self-pay | Admitting: Cardiology

## 2023-10-22 LAB — LIPOPROTEIN A (LPA): Lipoprotein (a): 13.7 nmol/L (ref <75.0)

## 2023-10-22 NOTE — Telephone Encounter (Signed)
 Pt c/o medication issue:  1. Name of Medication: lisinopril (ZESTRIL) 40 MG tablet  metoprolol  succinate (TOPROL  XL) 25 MG 24 hr tablet  2. How are you currently taking this medication (dosage and times per day)? As directed  3. Are you having a reaction (difficulty breathing--STAT)? No   4. What is your medication issue? Wife wants to know if he is suppose to be taking both medications. Please advise

## 2023-10-22 NOTE — Telephone Encounter (Signed)
 Wife wants to know if pt can have work note until he comes for his f/u on 7/29. Please advise

## 2023-10-22 NOTE — Telephone Encounter (Signed)
 Error

## 2023-10-22 NOTE — Telephone Encounter (Signed)
 Spoke w patient and his wife. Confirmed w him to take toprol  and lisinopril as both are needed to help heart function.     The other concern is he will need a note for being out of work, and plans to request FMLA paperwork so that it can be completed as well.   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  He is expecting he'll be out of work until his hospital follow up on 11/04/23.    Routed previous encounter from today to Dr. Mady.  See that encounter for addition details.

## 2023-10-22 NOTE — Telephone Encounter (Signed)
 Spoke w patient and his wife. Confirmed w him to take toprol  and lisinopril as both are needed to help heart function.    The other concern is he will need a note for being out of work, and plans to request FMLA paperwork so that it can be completed as well.  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  He is expecting he'll be out of work until his hospital follow up on 11/04/23.

## 2023-10-23 ENCOUNTER — Encounter: Payer: Self-pay | Admitting: *Deleted

## 2023-10-23 NOTE — Telephone Encounter (Signed)
 I will defer the question regarding medication interactions with methotrexate to our pharmacy team.  Agree with taking caffeine with ticagrelor ,  though his dizzy spell or panic attack would not be a typical side effect of this medication.  I suggest he follow-up with his PCP and/or primary cardiologist for further evaluation.  If symptoms worsen, he should go to the ER for further evaluation.  Lonni Hanson, MD [facility]

## 2023-10-23 NOTE — Telephone Encounter (Signed)
 Called patient and advised an out of work note will be sent to him in the patient portal.  Today patient reports 2 concerns 1.) he wants to make sure methotrexate can be taken w his new medications 2.) he is experiencing a I don't know if it's a dizzy spell or panic attack. Had difficulty describing.   Denies anxiety.  He tried to explain to nurse at hospital too.  Blood sugar 303 today.  He doesn't know about his blood pressure - he is getting a cuff for at home soon.  Noticed yesterday at [place] when he laid down. He had to sit up to clear myself  this is not room spinning.  I asked about shortness of breath and he states - the reason I get SOB is because I'm panicking when this feeling comes on.  I adv to monitor BP at home and also to use caffeine with Brilinta  doses. He will try this and continue to monitor.

## 2023-10-23 NOTE — Telephone Encounter (Signed)
 Yes, you can draft a note for the patient to stay out of work until he is seen in follow-up on 11/04/23.  Thanks.  Lonni Hanson, MD [facility]

## 2023-10-24 ENCOUNTER — Telehealth (HOSPITAL_COMMUNITY): Payer: Self-pay

## 2023-10-24 ENCOUNTER — Telehealth: Payer: Self-pay | Admitting: Cardiology

## 2023-10-24 NOTE — Telephone Encounter (Signed)
 Only significant interaction I see is with aspirin :  Concurrent use of ASPIRIN  and METHOTREXATE may result in increased methotrexate exposure, an increased risk of methotrexate toxicity, reduced active metabolite formation and possibly reduced methotrexate efficacy.

## 2023-10-24 NOTE — Telephone Encounter (Signed)
 Faxed outside referral for Phase II Cardiac Rehab to [facility].

## 2023-10-24 NOTE — Telephone Encounter (Signed)
 Patient came in today with two forms for [facility].  One is Critical Illness Claim and the other is for Short Term Disability.  He will be bringing an FMLA form next week.  He signed three releases of information and paid $87 fee.  Forms in Katlyn West's box.

## 2023-10-24 NOTE — Telephone Encounter (Signed)
 Thank you for the information.  In light of this, I recommend that Mr. Quintela reach out to his methotrexate prescriber to inquire about the need for dose adjustment.  It would be reasonable for us  to discontinue aspirin  3 months after PCI and continue ticagrelor  monotherapy.  Lonni Hanson, MD [facility]

## 2023-10-27 NOTE — Telephone Encounter (Signed)
 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 West's box.

## 2023-11-02 DIAGNOSIS — Z0279 Encounter for issue of other medical certificate: Secondary | ICD-10-CM

## 2023-11-03 NOTE — Progress Notes (Unsigned)
 Cardiology Office Note    Date:  11/04/2023  ID:  Clayton Gallegos, DOB 1965-09-05, MRN [id number] PCP:  [name], PA-C  Cardiologist:  [name], MD  Electrophysiologist:  None   Chief Complaint: Follow up for CAD   History of Present Illness: .    Clayton Gallegos is a 58 y.o. male with visit-pertinent history of hypertension, diabetes, tobacco abuse.  Patient presented to [facility], ED on 7/14 with complaints of chest pain.  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  In the ED he was noted to have elevated troponin from 16-68.  Patient was admitted to cardiology for evaluation and transferred to [facility].  Patient underwent cardiac catheterization with significant two-vessel CAD with total occlusion of the RCA with left-to-right collaterals as well as 95% stenosis of large OM1 branch with PCI/DES x 1.  Patient noted to have moderate mid LAD and distal circumflex stenosis which were not hemodynamically significant with RFR of 0.91 in both lesions.  Echocardiogram indicated LVEF of 40 to 45%, grade 2 diastolic dysfunction, hypokinesis of the left ventricle, basal mid inferior septal wall with moderate hypokinesis of the basal mid anterior lateral wall, normal RV function, no significant valvular disease.  Patient recommended to continue DAPT with aspirin /Brilinta  for at least 1 year, started on metformin  500 mg extended release daily for hemoglobin A1c of 7.7.  Today he presents for follow-up.  He reports that he has been doing well overall.  Patient reports 2 episodes of some slight chest discomfort after eating, he feels that this is related to a history of acid reflux, 1 episode specifically after laying down after eating, not similar to his prior angina.  Patient also reports that following discharge he had some episodes of what he described as heavy  breathing, has noted some improvement with this, questions if this is related to being outside.  Patient reports that he has been walking in the mornings and has been tolerating overall well, notes that he has had decreased stamina.  Patient denies any palpitations, lower extremity edema, orthopnea or PND.  Patient does endorse concerns regarding returning to work, notes that he works a very strenuous job and has concerns regarding the temperature fluctuations that he encounters at work, notes that temperatures can vary between 40 F to over 100 F.  ROS: .   Today he denies lower extremity edema, fatigue, palpitations, melena, hematuria, hemoptysis, diaphoresis, weakness, presyncope, syncope, orthopnea, and PND.  All other systems are reviewed and otherwise negative. Studies Reviewed: SABRA   EKG:  EKG is ordered today, personally reviewed, demonstrating  EKG Interpretation Date/Time:  Tuesday November 04 2023 13:15:32 EDT Ventricular Rate:  77 PR Interval:  146 QRS Duration:  96 QT Interval:  380 QTC Calculation: 430 R Axis:   54  Text Interpretation: Normal sinus rhythm Normal ECG When compared with ECG of 21-Oct-2023 06:14, Criteria for Septal infarct are no longer Present Confirmed by Emalia Witkop [phone]) on 11/04/2023 7:09:19 PM   CV Studies: Cardiac studies reviewed are outlined and summarized above. Otherwise please see EMR for full report. Cardiac Studies & Procedures   ______________________________________________________________________________________________ CARDIAC CATHETERIZATION  CARDIAC CATHETERIZATION 10/20/2023  Conclusion Conclusions: Significant two-vessel coronary artery disease, including subacute versus chronic total occlusion of distal RCA with left-to-right collaterals as well as 95% stenosis of large OM1 branch.  Moderate  mid LAD and distal LCx stenoses are not hemodynamically significant (RFR = 0.91 of both lesions). Mildly reduced left ventricular systolic function (LVEF  45-50%) with subtle mid/apical anterior/anterolateral hypokinesis. Mildly elevated left ventricular filling pressure (LVEDP 20 mmHg). Successful PCI of OM1 using Synergy 2.25 x 16 mm drug-eluting stent (post-dilated to 2.6 mm) with 0% residual stenosis and TIMI-3 flow.  Recommendations: Dual antiplatelet therapy with aspirin  and ticagrelor  for at least 12 months. Aggressive secondary prevention of coronary artery disease.  Lonni Hanson, MD [facility]  Findings Coronary Findings Diagnostic  Dominance: Right  Left Anterior Descending Mid LAD lesion is 50% stenosed. Pressure gradient was performed on the lesion using a GUIDEWIRE PRESSURE X 175 and CATH VISTA GUIDE 6FR XBLD 3.5. RFR: 0.91.  First Diagonal Branch Vessel is small in size.  Second Diagonal Branch Vessel is moderate in size. 2nd Diag lesion is 50% stenosed. The lesion is irregular.  Third Diagonal Branch Vessel is small in size.  Left Circumflex Vessel is large. Dist Cx lesion is 70% stenosed. Pressure gradient was performed on the lesion using a GUIDEWIRE PRESSURE X 175 and CATH VISTA GUIDE 6FR XBLD 3.5. RFR: 0.91.  First Obtuse Marginal Branch Vessel is large in size. 1st Mrg lesion is 95% stenosed.  Second Obtuse Marginal Branch Vessel is moderate in size. There is mild disease in the vessel.  Third Obtuse Marginal Branch Vessel is small in size. 3rd Mrg lesion is 70% stenosed.  Fourth Obtuse Marginal Branch Vessel is large in size.  Right Coronary Artery Vessel is moderate in size. There is mild diffuse disease throughout the vessel. Dist RCA lesion is 100% stenosed.  Right Posterior Descending Artery Vessel is moderate in size.  Right Posterior Atrioventricular Artery Vessel is moderate in size. Vessel is angiographically normal. Collaterals RPAV filled by collaterals from Dist Cx.  First Right Posterolateral Branch Vessel is small in size.  Intervention  1st Mrg  lesion Stent Lesion length:  14 mm. CATH VISTA GUIDE 6FR XBLD 3.5 guide catheter was inserted. Lesion crossed with guidewire using a WIRE RUNTHROUGH [place] 014 180. Pre-stent angioplasty was performed using a BALLOON [place]ER[place] MR 2.0X12. Maximum pressure:  6 atm. A drug-eluting stent was successfully placed using a STENT SYNERGY XD 2.25X16. Maximum pressure: 17 atm. Stent strut is well apposed. Post-stent angioplasty was performed using a BALLOON [place] EMERGE MR 2.5X12. Maximum pressure:  18 atm. Post-Intervention Lesion Assessment The intervention was successful. Pre-interventional TIMI flow is 3. Post-intervention TIMI flow is 3. No complications occurred at this lesion. There is a 0% residual stenosis post intervention.     ECHOCARDIOGRAM  ECHOCARDIOGRAM COMPLETE 10/21/2023  Narrative ECHOCARDIOGRAM REPORT    Patient Name:   MACKEY VARRICCHIO Date of Exam: 10/21/2023 Medical Rec #:  [id number]     Height:       69.0 in Accession #:    [id number]    Weight:       209.0 lb Date of Birth:  [birth year]     BSA:          2.105 m Patient Age:    58 years      BP:           147/73 mmHg Patient Gender: M             HR:           61 bpm. Exam Location:  Inpatient  Procedure: 2D Echo, Cardiac Doppler and Color Doppler (Both Spectral and Color Flow Doppler were utilized during  procedure).  Indications:    Acute myocardial infarction  History:        Patient has no prior history of Echocardiogram examinations.  Sonographer:    Philomena Daring Referring Phys: Deianira.Dane CHRISTOPHER END  IMPRESSIONS   1. Left ventricular ejection fraction, by estimation, is 40 to 45%. The left ventricle has mildly decreased function. The left ventricle demonstrates regional wall motion abnormalities (see scoring diagram/findings for description). Left ventricular diastolic parameters are consistent with Grade II diastolic dysfunction (pseudonormalization). Elevated left atrial pressure. There is mild hypokinesis of the left  ventricular, basal-mid inferoseptal wall and inferior wall. There is moderate hypokinesis of the left ventricular, basal-mid anterolateral wall. 2. Right ventricular systolic function is normal. The right ventricular size is normal. Tricuspid regurgitation signal is inadequate for assessing PA pressure. 3. The mitral valve is normal in structure. No evidence of mitral valve regurgitation. No evidence of mitral stenosis. 4. The aortic valve is tricuspid. There is mild calcification of the aortic valve. Aortic valve regurgitation is not visualized. Aortic valve sclerosis/calcification is present, without any evidence of aortic stenosis. 5. The inferior vena cava is normal in size with greater than 50% respiratory variability, suggesting right atrial pressure of 3 mmHg.  FINDINGS Left Ventricle: Left ventricular ejection fraction, by estimation, is 40 to 45%. The left ventricle has mildly decreased function. The left ventricle demonstrates regional wall motion abnormalities. Mild hypokinesis of the left ventricular, basal-mid inferoseptal wall and inferior wall. Moderate hypokinesis of the left ventricular, basal-mid anterolateral wall. The left ventricular internal cavity size was normal in size. There is no left ventricular hypertrophy. Left ventricular diastolic parameters are consistent with Grade II diastolic dysfunction (pseudonormalization). Elevated left atrial pressure.   LV Wall Scoring: The antero-lateral wall, inferior wall, and basal inferoseptal segment are hypokinetic.  Right Ventricle: The right ventricular size is normal. No increase in right ventricular wall thickness. Right ventricular systolic function is normal. Tricuspid regurgitation signal is inadequate for assessing PA pressure.  Left Atrium: Left atrial size was normal in size.  Right Atrium: Right atrial size was normal in size.  Pericardium: There is no evidence of pericardial effusion.  Mitral Valve: The mitral  valve is normal in structure. No evidence of mitral valve regurgitation. No evidence of mitral valve stenosis.  Tricuspid Valve: The tricuspid valve is normal in structure. Tricuspid valve regurgitation is not demonstrated.  Aortic Valve: The aortic valve is tricuspid. There is mild calcification of the aortic valve. Aortic valve regurgitation is not visualized. Aortic valve sclerosis/calcification is present, without any evidence of aortic stenosis.  Pulmonic Valve: The pulmonic valve was grossly normal. Pulmonic valve regurgitation is not visualized. No evidence of pulmonic stenosis.  Aorta: The aortic root and ascending aorta are structurally normal, with no evidence of dilitation.  Venous: The inferior vena cava is normal in size with greater than 50% respiratory variability, suggesting right atrial pressure of 3 mmHg.  IAS/Shunts: No atrial level shunt detected by color flow Doppler.   LEFT VENTRICLE PLAX 2D LVIDd:         5.50 cm     Diastology LVIDs:         4.00 cm     LV e' medial:    7.51 cm/s LV PW:         1.00 cm     LV E/e' medial:  12.3 LV IVS:        1.10 cm     LV e' lateral:   8.70 cm/s LVOT diam:  2.00 cm     LV E/e' lateral: 10.6 LV SV:         51 LV SV Index:   24 LVOT Area:     3.14 cm  LV Volumes (MOD) LV vol d, MOD A2C: 69.2 ml LV vol d, MOD A4C: 96.1 ml LV vol s, MOD A2C: 26.2 ml LV vol s, MOD A4C: 38.8 ml LV SV MOD A2C:     43.0 ml LV SV MOD A4C:     96.1 ml LV SV MOD BP:      48.5 ml  RIGHT VENTRICLE             IVC RV S prime:     11.50 cm/s  IVC diam: 1.80 cm TAPSE (M-mode): 1.8 cm  LEFT ATRIUM             Index        RIGHT ATRIUM           Index LA diam:        2.80 cm 1.33 cm/m   RA Area:     12.20 cm LA Vol (A2C):   42.5 ml 20.19 ml/m  RA Volume:   30.10 ml  14.30 ml/m LA Vol (A4C):   39.1 ml 18.57 ml/m LA Biplane Vol: 43.7 ml 20.76 ml/m AORTIC VALVE LVOT Vmax:   85.90 cm/s LVOT Vmean:  56.600 cm/s LVOT VTI:    0.162  m  AORTA Ao Root diam: 2.60 cm Ao Asc diam:  3.30 cm  MITRAL VALVE MV Area (PHT): 3.77 cm    SHUNTS MV Decel Time: 201 msec    Systemic VTI:  0.16 m MV E velocity: 92.20 cm/s  Systemic Diam: 2.00 cm MV A velocity: 68.70 cm/s MV E/A ratio:  1.34  Mihai Croitoru MD Electronically signed by Jerel Balding MD Signature Date/Time: 10/21/2023/2:14:52 PM    Final          ______________________________________________________________________________________________       Current Reported Medications:.    Current Meds  Medication Sig   Blood Glucose Monitoring Suppl (ONETOUCH VERIO REFLECT) w/Device KIT as directed.   Lancets (ONETOUCH DELICA PLUS LANCET33G) MISC Apply topically as directed.   ONETOUCH VERIO test strip as directed.    Physical Exam:   VS:  BP 124/76   Pulse 77   Ht 5' 8 (1.727 m)   Wt 195 lb 6.4 oz (88.6 kg)   SpO2 97%   BMI 29.71 kg/m    Wt Readings from Last 3 Encounters:  11/04/23 195 lb 6.4 oz (88.6 kg)  10/20/23 209 lb (94.8 kg)  03/09/19 250 lb (113.4 kg)    GEN: Well nourished, well developed in no acute distress NECK: No JVD; No carotid bruits CARDIAC: RRR, no murmurs, rubs, gallops, right radial cath site clean and intact without evidence of hematoma RESPIRATORY:  Clear to auscultation without rales, wheezing or rhonchi  ABDOMEN: Soft, non-tender, non-distended EXTREMITIES:  No edema; No acute deformity     Asessement and Plan:.    CAD: Patient underwent cardiac catheterization on 10/20/2023 with significant two-vessel CAD with total occlusion of RCA with left-to-right collaterals as well as 95% stenosis of large OM1 branch with PCI/DES x 1, did have moderate mid LAD and distal circumflex stenosis which were not hemodynamically significant with RFR of 0.91 and both lesions.  Recommended for DAPT with aspirin  and Brilinta  for at least 1 year. Today he reports that he is overall doing well, notes some chest discomfort which he  feels is  related to acid reflux, denies any similarity to prior angina.  Notes some occasional heavy breathing that has been improving, he will continue to monitor.  Discussed starting on Protonix , patient declined at this time.  His right radial cath site is clean and intact without evidence of hematoma.  Patient can start cardiac rehab.  Given degree of exertion that patient notes he encounters at work as well as significant temperature fluctuations work note provided to allow return to work in 4 weeks as this will allow for him to start cardiac rehab.  Reviewed ED precautions with patient.  Continue aspirin  81 mg daily, Lipitor 80 mg daily, lisinopril 40 mg daily, metoprolol  succinate 25 mg daily and Brilinta  90 mg twice daily, refills sent to home pharmacy.  Ischemic cardiomyopathy: Echo indicated LVEF of 40 to 45%, grade 2 diastolic dysfunction, hypokinesis of the left ventricle, basal mid inferior septal wall with moderate hypokinesis of the basal mid anterolateral wall, normal RV function, no significant valvular disease.  Patient was started on Toprol -XL 25 mg daily and lisinopril 40 mg daily.  Today he denies any significant shortness of breath, notes some occasional episodes of heavy breathing that have been improving.  Patient denies any significant weight gain, lower extremity edema, orthopnea or PND.  Continue lisinopril and metoprolol , check bmet today, if kidney function electrolytes are overall stable consider starting spironolactone  12.5 mg daily.  Hyperlipidemia: LDL 64 and HDL 26 while inpatient.  Continue atorvastatin  80 mg daily. Check fasting lipid profile and LFTs in 6 to 8 weeks.   Renal artery stenosis: Noted on CT with 50% left renal artery stenosis, normal renal function.  Continue lisinopril 40 mg daily and metoprolol  XL 25 mg daily.  Diabetes: Hemoglobin A1c 7.7, patient was started on metformin  500 mg extended release daily.  Patient to follow-up with PCP regarding ongoing  management.  Tobacco use: Reports he is no longer smoking, congratulated.    Disposition: F/u in [place] office in 6-8 weeks.   Signed, Jashay Roddy D Hadlei Stitt, NP

## 2023-11-04 ENCOUNTER — Ambulatory Visit: Attending: Cardiology | Admitting: Cardiology

## 2023-11-04 ENCOUNTER — Encounter: Payer: Self-pay | Admitting: Cardiology

## 2023-11-04 VITALS — BP 124/76 | HR 77 | Ht 68.0 in | Wt 195.4 lb

## 2023-11-04 DIAGNOSIS — I214 Non-ST elevation (NSTEMI) myocardial infarction: Secondary | ICD-10-CM | POA: Diagnosis not present

## 2023-11-04 DIAGNOSIS — I701 Atherosclerosis of renal artery: Secondary | ICD-10-CM | POA: Diagnosis not present

## 2023-11-04 DIAGNOSIS — E118 Type 2 diabetes mellitus with unspecified complications: Secondary | ICD-10-CM | POA: Diagnosis not present

## 2023-11-04 DIAGNOSIS — I255 Ischemic cardiomyopathy: Secondary | ICD-10-CM

## 2023-11-04 DIAGNOSIS — E782 Mixed hyperlipidemia: Secondary | ICD-10-CM

## 2023-11-04 MED ORDER — METOPROLOL SUCCINATE ER 25 MG PO TB24: 25.0000 mg | ORAL_TABLET | Freq: Every day | ORAL | 3 refills | Status: AC

## 2023-11-04 MED ORDER — TICAGRELOR 90 MG PO TABS: 90.0000 mg | ORAL_TABLET | Freq: Two times a day (BID) | ORAL | 2 refills | Status: AC

## 2023-11-04 MED ORDER — NITROGLYCERIN 0.4 MG SL SUBL: 0.4000 mg | SUBLINGUAL_TABLET | SUBLINGUAL | 2 refills | Status: AC | PRN

## 2023-11-04 MED ORDER — ATORVASTATIN CALCIUM 80 MG PO TABS: 80.0000 mg | ORAL_TABLET | Freq: Every day | ORAL | 3 refills | Status: DC

## 2023-11-04 NOTE — Patient Instructions (Signed)
 Medication Instructions:  No changes *If you need a refill on your cardiac medications before your next appointment, please call your pharmacy*  Lab Work: Today we are going to draw a CBC and Bmet In 6-8 weeks we are going to need fasting labs drawn If you have labs (blood work) drawn today and your tests are completely normal, you will receive your results only by: MyChart Message (if you have MyChart) OR A paper copy in the mail If you have any lab test that is abnormal or we need to change your treatment, we will call you to review the results.  Testing/Procedures: No testing  Follow-Up: At [facility], you and your health needs are our priority.  As part of our continuing mission to provide you with exceptional heart care, our providers are all part of one team.  This team includes your primary Cardiologist (physician) and Advanced Practice Providers or APPs (Physician Assistants and Nurse Practitioners) who all work together to provide you with the care you need, when you need it.  Your next appointment:   8 week(s)  Provider:   Almarie Crate, NP or [name], MD   We recommend signing up for the patient portal called MyChart.  Sign up information is provided on this After Visit Summary.  MyChart is used to connect with patients for Virtual Visits (Telemedicine).  Patients are able to view lab/test results, encounter notes, upcoming appointments, etc.  Non-urgent messages can be sent to your provider as well.   To learn more about what you can do with MyChart, go to [email or web address].

## 2023-11-05 ENCOUNTER — Encounter: Payer: Self-pay | Admitting: Cardiology

## 2023-11-05 ENCOUNTER — Ambulatory Visit: Payer: Self-pay | Admitting: Cardiology

## 2023-11-05 DIAGNOSIS — Z79899 Other long term (current) drug therapy: Secondary | ICD-10-CM

## 2023-11-05 DIAGNOSIS — I214 Non-ST elevation (NSTEMI) myocardial infarction: Secondary | ICD-10-CM

## 2023-11-05 LAB — BASIC METABOLIC PANEL WITH GFR
BUN/Creatinine Ratio: 26 — ABNORMAL HIGH (ref 9–20)
BUN: 30 mg/dL — ABNORMAL HIGH (ref 6–24)
CO2: 19 mmol/L — ABNORMAL LOW (ref 20–29)
Calcium: 9.8 mg/dL (ref 8.7–10.2)
Chloride: 100 mmol/L (ref 96–106)
Creatinine, Ser: 1.16 mg/dL (ref 0.76–1.27)
Glucose: 120 mg/dL — ABNORMAL HIGH (ref 70–99)
Potassium: 4.9 mmol/L (ref 3.5–5.2)
Sodium: 140 mmol/L (ref 134–144)
eGFR: 73 mL/min/1.73 (ref >59)

## 2023-11-05 LAB — CBC
Hematocrit: 44.3 % (ref 37.5–51.0)
Hemoglobin: 14.8 g/dL (ref 13.0–17.7)
MCH: 32.3 pg (ref 26.6–33.0)
MCHC: 33.4 g/dL (ref 31.5–35.7)
MCV: 97 fL (ref 79–97)
Platelets: 279 x10E3/uL (ref 150–450)
RBC: 4.58 x10E6/uL (ref 4.14–5.80)
RDW: 14.9 % (ref 11.6–15.4)
WBC: 9.6 x10E3/uL (ref 3.4–10.8)

## 2023-11-06 MED ORDER — SPIRONOLACTONE 25 MG PO TABS: 12.5000 mg | ORAL_TABLET | Freq: Every day | ORAL | 3 refills | Status: AC

## 2023-11-06 NOTE — Telephone Encounter (Signed)
 Called patient advised of below they verbalized understanding.

## 2023-11-06 NOTE — Telephone Encounter (Signed)
-----   Message from Rosabel BIRCH [place] sent at 11/06/2023  1:05 PM EDT ----- Please let Mr. Gandolfo know that his CBC shows no evidence of anemia or infection. His kidney function is normal and electrolytes are normal. Recommend starting spironolactone  12.5 mg daily, he will  need a repeat BMET in two weeks for monitoring.  ----- Message ----- From: Interface, [facility] Lab Results In Sent: 11/05/2023   3:36 AM EDT To: Katlyn D West, NP

## 2023-11-11 NOTE — Telephone Encounter (Signed)
 [employer] and [employer] (3 forms) were scanned into patient's chart and left at front for patient to pick up. Billing notified.

## 2023-11-20 ENCOUNTER — Telehealth: Payer: Self-pay | Admitting: Cardiology

## 2023-11-20 NOTE — Telephone Encounter (Signed)
 Calling to get orders for the patient's rehab. Fax #[phone]. Please advise

## 2023-11-20 NOTE — Telephone Encounter (Signed)
 This will be sent to cardiac rehab front desk, Dellia Novak

## 2023-11-20 NOTE — Telephone Encounter (Signed)
 Wondering if the orders that she faxed over have been received. The provider is Dr Dorn Ross.  Informed her that provider is at the [place] office. Gave her their phone number.

## 2023-11-22 ENCOUNTER — Other Ambulatory Visit (HOSPITAL_COMMUNITY): Payer: Self-pay

## 2023-11-22 LAB — LIPID PANEL
Chol/HDL Ratio: 2.4 ratio (ref 0.0–5.0)
Cholesterol, Total: 62 mg/dL — ABNORMAL LOW (ref 100–199)
HDL: 26 mg/dL — ABNORMAL LOW (ref >39)
LDL Chol Calc (NIH): 16 mg/dL (ref 0–99)
Triglycerides: 104 mg/dL (ref 0–149)
VLDL Cholesterol Cal: 20 mg/dL (ref 5–40)

## 2023-11-22 LAB — HEPATIC FUNCTION PANEL
ALT: 75 IU/L — ABNORMAL HIGH (ref 0–44)
AST: 34 IU/L (ref 0–40)
Albumin: 4.3 g/dL (ref 3.8–4.9)
Alkaline Phosphatase: 84 IU/L (ref 44–121)
Bilirubin Total: 0.6 mg/dL (ref 0.0–1.2)
Bilirubin, Direct: 0.27 mg/dL (ref 0.00–0.40)
Total Protein: 6.5 g/dL (ref 6.0–8.5)

## 2023-11-22 LAB — BASIC METABOLIC PANEL WITH GFR
BUN/Creatinine Ratio: 16 (ref 9–20)
BUN: 17 mg/dL (ref 6–24)
CO2: 19 mmol/L — ABNORMAL LOW (ref 20–29)
Calcium: 9.4 mg/dL (ref 8.7–10.2)
Chloride: 103 mmol/L (ref 96–106)
Creatinine, Ser: 1.04 mg/dL (ref 0.76–1.27)
Glucose: 136 mg/dL — ABNORMAL HIGH (ref 70–99)
Potassium: 4.8 mmol/L (ref 3.5–5.2)
Sodium: 137 mmol/L (ref 134–144)
eGFR: 83 mL/min/1.73 (ref >59)

## 2023-12-01 ENCOUNTER — Encounter: Payer: Self-pay | Admitting: Cardiology

## 2023-12-01 ENCOUNTER — Telehealth: Payer: Self-pay | Admitting: Cardiology

## 2023-12-01 NOTE — Telephone Encounter (Signed)
 Pt would like a c/b regarding a Return To Work note. Pt states that he needs return date to be for 8/29 as well as no restrictions. Please advise

## 2023-12-12 NOTE — Telephone Encounter (Signed)
 See my chart message

## 2023-12-26 ENCOUNTER — Telehealth: Payer: Self-pay | Admitting: Cardiology

## 2023-12-26 NOTE — Telephone Encounter (Signed)
 Pt is wondering how they would make payment for critical illness insurance paperwork if they are faxing them. Please advise

## 2023-12-26 NOTE — Telephone Encounter (Signed)
 Spoke to patient's daughter she stated her father has another critical illness form to be completed.She wants to know if she can fax form and how can she pay.Advised I will send message to staff who handles forms.

## 2023-12-30 ENCOUNTER — Ambulatory Visit: Attending: Nurse Practitioner | Admitting: Nurse Practitioner

## 2023-12-30 ENCOUNTER — Encounter: Payer: Self-pay | Admitting: Nurse Practitioner

## 2023-12-30 VITALS — BP 118/70 | HR 64 | Ht 70.0 in | Wt 190.6 lb

## 2023-12-30 DIAGNOSIS — I2510 Atherosclerotic heart disease of native coronary artery without angina pectoris: Secondary | ICD-10-CM

## 2023-12-30 DIAGNOSIS — M79605 Pain in left leg: Secondary | ICD-10-CM

## 2023-12-30 DIAGNOSIS — M79604 Pain in right leg: Secondary | ICD-10-CM | POA: Diagnosis not present

## 2023-12-30 DIAGNOSIS — I10 Essential (primary) hypertension: Secondary | ICD-10-CM

## 2023-12-30 DIAGNOSIS — E785 Hyperlipidemia, unspecified: Secondary | ICD-10-CM

## 2023-12-30 DIAGNOSIS — M25559 Pain in unspecified hip: Secondary | ICD-10-CM

## 2023-12-30 NOTE — Progress Notes (Unsigned)
 Cardiology Office Note   Date:  12/30/2023 ID:  QUENTIN SHOREY, DOB 04/04/1966, MRN [id number] PCP: Honora Darice RIGGERS  [facility] Providers Cardiologist:  [name], MD     History of Present Illness Clayton Gallegos is a 58 y.o. male with a PMH of CAD, ICM, HTN, HLD, T2DM, renal artery stenosis, and tobacco abuse, who presents today for 8 week follow-up.   Hospital admission July 2025 due to chest pain. Transferred to [facility] d/t troponin elevation and underwent LHC, noted to have total occlusion of RCA with left-to-right collaterals, 95% stenosis of large OM1 branch and received PCI/DES x 1.  See full report below.  Echocardiogram revealed LVEF 40 to 45%, grade 2 DD, hypokinesis of left ventricle, basal mid inferior septal wall with moderate hypokinesis of basal mid anterior lateral wall, no significant valvular disease.  Recommended to continue DAPT with aspirin  and Brilinta  for 1 year.  Last seen by Katlyn West, NP on November 04, 2023.  He noted about 2 episodes of some slight chest discomfort after eating, felt to be related to history of acid reflux.  Noted some episodes of heavy breathing after discharge.  Improvement with the symptoms were noted.  Noted some decreased stamina.  Today he presents for 8 week follow-up. Chief complaint today is hip and leg pain. Has prevented him from doing his cardiac rehab sessions. Wife says he has been undergoing testing for Lyme Disease and Alpha Gal. Had recent X-ray performed that wife says just showed arthritis but noting else to indicate his symptoms. Denies any chest pain, shortness of breath, palpitations, syncope, presyncope, dizziness, orthopnea, PND, swelling or significant weight changes, acute bleeding, or claudication. Says he is very active at work.   ROS: Negative. See HPI.   Studies Reviewed  EKG: EKG is not ordered today.   Echo 10/2023:  1. Left ventricular ejection fraction, by estimation, is 40 to 45%. The  left ventricle  has mildly decreased function. The left ventricle  demonstrates regional wall motion abnormalities (see scoring  diagram/findings for description). Left ventricular  diastolic parameters are consistent with Grade II diastolic dysfunction  (pseudonormalization). Elevated left atrial pressure. There is mild  hypokinesis of the left ventricular, basal-mid inferoseptal wall and  inferior wall. There is moderate hypokinesis  of the left ventricular, basal-mid anterolateral wall.   2. Right ventricular systolic function is normal. The right ventricular  size is normal. Tricuspid regurgitation signal is inadequate for assessing  PA pressure.   3. The mitral valve is normal in structure. No evidence of mitral valve  regurgitation. No evidence of mitral stenosis.   4. The aortic valve is tricuspid. There is mild calcification of the  aortic valve. Aortic valve regurgitation is not visualized. Aortic valve  sclerosis/calcification is present, without any evidence of aortic  stenosis.   5. The inferior vena cava is normal in size with greater than 50%  respiratory variability, suggesting right atrial pressure of 3 mmHg.  LHC 10/2023:  Conclusions: Significant two-vessel coronary artery disease, including subacute versus chronic total occlusion of distal RCA with left-to-right collaterals as well as 95% stenosis of large OM1 branch.  Moderate mid LAD and distal LCx stenoses are not hemodynamically significant (RFR = 0.91 of both lesions). Mildly reduced left ventricular systolic function (LVEF 45-50%) with subtle mid/apical anterior/anterolateral hypokinesis. Mildly elevated left ventricular filling pressure (LVEDP 20 mmHg). Successful PCI of OM1 using Synergy 2.25 x 16 mm drug-eluting stent (post-dilated to 2.6 mm) with 0% residual stenosis and  TIMI-3 flow.   Recommendations: Dual antiplatelet therapy with aspirin  and ticagrelor  for at least 12 months. Aggressive secondary prevention of coronary  artery disease.   Lonni Hanson, MD [facility]    Physical Exam VS:  BP 118/70   Pulse 64   Ht 5' 10 (1.778 m)   Wt 190 lb 9.6 oz (86.5 kg)   SpO2 98%   BMI 27.35 kg/m        Wt Readings from Last 3 Encounters:  12/30/23 190 lb 9.6 oz (86.5 kg)  11/04/23 195 lb 6.4 oz (88.6 kg)  10/20/23 209 lb (94.8 kg)    GEN: Well nourished, well developed in no acute distress NECK: No JVD; No carotid bruits CARDIAC: S1/S2, RRR, no murmurs, rubs, gallops RESPIRATORY:  Clear to auscultation without rales, wheezing or rhonchi  ABDOMEN: Soft, non-tender, non-distended EXTREMITIES:  No edema; No deformity   ASSESSMENT AND PLAN  CAD, HLD Stable with no anginal symptoms. No indication for ischemic evaluation.  Continue aspirin , lisinopril, Toprol -XL, Brilinta , and spironolactone , as well as nitroglycerin  as needed.  Will hold atorvastatin  x 1 week-see below.  Heart healthy diet and regular cardiovascular exercise encouraged.    HTN BP stable. Discussed to monitor BP at home at least 2 hours after medications and sitting for 5-10 minutes.  Continue current antihypertensive regimen. Discussed to monitor BP at home at least 2 hours after medications and sitting for 5-10 minutes. Heart healthy diet and regular cardiovascular exercise encouraged.   3. Leg pain, hip pain Etiology unclear, possibly related to atorvastatin .  Will hold atorvastatin  x 1 week and he will update us  with his symptoms in 1 week.  If this is medication related, plan to either titrate statin dosage/switch to new statin/possibly add co-Q10 on to his medication regimen.  Will continue to follow.  He will update us  in 1 week regarding his symptoms.  I spent a total duration of 30 minutes reviewing prior notes, reviewing outside records including  labs,  face-to-face counseling of medical condition, pathophysiology, evaluation, management, and documenting the findings in the note.     Dispo: Follow-up with MD/APP in 3  months or sooner if any changes.  Signed, Almarie Crate, NP

## 2023-12-30 NOTE — Patient Instructions (Addendum)
 Medication Instructions:  Your physician recommends that you continue on your current medications as directed. Please refer to the Current Medication list given to you today. Please HOLD Atorvastatin  for 1 week then call and update us  on your symptoms.  Labwork: None   Testing/Procedures: None   Follow-Up: Your physician recommends that you schedule a follow-up appointment in: 3 Months   Any Other Special Instructions Will Be Listed Below (If Applicable).  If you need a refill on your cardiac medications before your next appointment, please call your pharmacy.

## 2024-01-01 NOTE — Telephone Encounter (Signed)
 Received forms from [employer]. - placed on desk of Almarie Crate, NP

## 2024-01-01 NOTE — Telephone Encounter (Signed)
 Left message on patient's phone that we need to have a Medical Release form completed and explained that we charge $29.00 for completion of forms.

## 2024-01-02 ENCOUNTER — Telehealth: Payer: Self-pay | Admitting: Nurse Practitioner

## 2024-01-02 NOTE — Telephone Encounter (Signed)
 Patient says Wednesday paperwork for insurance was sent to the office. He would like to know if it has been received. Please advise.

## 2024-01-06 ENCOUNTER — Telehealth: Payer: Self-pay | Admitting: Nurse Practitioner

## 2024-01-06 DIAGNOSIS — Z0279 Encounter for issue of other medical certificate: Secondary | ICD-10-CM

## 2024-01-06 NOTE — Telephone Encounter (Signed)
 Received forms from [place] [employer] PT came by today 01/06/2024 signed and paid $29 cash Forms placed on [employer] pt 7-14 turn around!

## 2024-01-06 NOTE — Telephone Encounter (Signed)
 Clayton Gallegos completed forms They have been faxed to [employer] pt LMT[employer] to pick up forms  Scanned into chart and faxed Billing their form  Placed in bin!

## 2024-01-08 NOTE — Telephone Encounter (Signed)
 Pt picked up forms.

## 2024-03-30 ENCOUNTER — Ambulatory Visit: Attending: Nurse Practitioner | Admitting: Nurse Practitioner

## 2024-03-30 ENCOUNTER — Encounter: Payer: Self-pay | Admitting: Nurse Practitioner

## 2024-03-30 VITALS — BP 106/64 | HR 82 | Ht 70.0 in | Wt 185.6 lb

## 2024-03-30 DIAGNOSIS — I2510 Atherosclerotic heart disease of native coronary artery without angina pectoris: Secondary | ICD-10-CM | POA: Diagnosis not present

## 2024-03-30 DIAGNOSIS — M79604 Pain in right leg: Secondary | ICD-10-CM

## 2024-03-30 DIAGNOSIS — M25559 Pain in unspecified hip: Secondary | ICD-10-CM

## 2024-03-30 DIAGNOSIS — M79605 Pain in left leg: Secondary | ICD-10-CM | POA: Diagnosis not present

## 2024-03-30 DIAGNOSIS — E785 Hyperlipidemia, unspecified: Secondary | ICD-10-CM

## 2024-03-30 DIAGNOSIS — I10 Essential (primary) hypertension: Secondary | ICD-10-CM

## 2024-03-30 NOTE — Progress Notes (Signed)
 " Cardiology Office Note   Date: 03/30/2024 ID:  Clayton Gallegos, DOB 01-28-1966, MRN [id number] PCP: Honora Darice RIGGERS  [facility] Providers Cardiologist:  [name], MD     History of Present Illness Clayton Gallegos is a 58 y.o. male with a PMH of CAD, ICM, HTN, HLD, T2DM, renal artery stenosis, and tobacco abuse, who presents today for scheduled follow-up.   Hospital admission July 2025 due to chest pain. Transferred to [facility] d/t troponin elevation and underwent LHC, noted to have total occlusion of RCA with left-to-right collaterals, 95% stenosis of large OM1 branch and received PCI/DES x 1.  See full report below.  Echocardiogram revealed LVEF 40 to 45%, grade 2 DD, hypokinesis of left ventricle, basal mid inferior septal wall with moderate hypokinesis of basal mid anterior lateral wall, no significant valvular disease.  Recommended to continue DAPT with aspirin  and Brilinta  for 1 year.  Last seen by Katlyn West, NP on November 04, 2023.  He noted about 2 episodes of some slight chest discomfort after eating, felt to be related to history of acid reflux.  Noted some episodes of heavy breathing after discharge.  Improvement with the symptoms were noted.  Noted some decreased stamina.  12/30/2023 - Today he presents for 8 week follow-up. Chief complaint today is hip and leg pain. Has prevented him from doing his cardiac rehab sessions. Wife says he has been undergoing testing for Lyme Disease and Alpha Gal. Had recent X-ray performed that wife says just showed arthritis but noting else to indicate his symptoms. Denies any chest pain, shortness of breath, palpitations, syncope, presyncope, dizziness, orthopnea, PND, swelling or significant weight changes, acute bleeding, or claudication. Says he is very active at work.   03/30/2024 -  Here for follow-up. Doing better since off statin and since he has seen the chiropractor. No longer taking atorvastatin . Denies any chest pain, shortness of  breath, palpitations, syncope, presyncope, dizziness, orthopnea, PND, swelling or significant weight changes, acute bleeding, or claudication.  ROS: Negative. See HPI.   Studies Reviewed  EKG: EKG is not ordered today.   Echo 10/2023:  1. Left ventricular ejection fraction, by estimation, is 40 to 45%. The  left ventricle has mildly decreased function. The left ventricle  demonstrates regional wall motion abnormalities (see scoring  diagram/findings for description). Left ventricular  diastolic parameters are consistent with Grade II diastolic dysfunction  (pseudonormalization). Elevated left atrial pressure. There is mild  hypokinesis of the left ventricular, basal-mid inferoseptal wall and  inferior wall. There is moderate hypokinesis  of the left ventricular, basal-mid anterolateral wall.   2. Right ventricular systolic function is normal. The right ventricular  size is normal. Tricuspid regurgitation signal is inadequate for assessing  PA pressure.   3. The mitral valve is normal in structure. No evidence of mitral valve  regurgitation. No evidence of mitral stenosis.   4. The aortic valve is tricuspid. There is mild calcification of the  aortic valve. Aortic valve regurgitation is not visualized. Aortic valve  sclerosis/calcification is present, without any evidence of aortic  stenosis.   5. The inferior vena cava is normal in size with greater than 50%  respiratory variability, suggesting right atrial pressure of 3 mmHg.  LHC 10/2023:  Conclusions: Significant two-vessel coronary artery disease, including subacute versus chronic total occlusion of distal RCA with left-to-right collaterals as well as 95% stenosis of large OM1 branch.  Moderate mid LAD and distal LCx stenoses are not hemodynamically significant (RFR = 0.91  of both lesions). Mildly reduced left ventricular systolic function (LVEF 45-50%) with subtle mid/apical anterior/anterolateral hypokinesis. Mildly elevated  left ventricular filling pressure (LVEDP 20 mmHg). Successful PCI of OM1 using Synergy 2.25 x 16 mm drug-eluting stent (post-dilated to 2.6 mm) with 0% residual stenosis and TIMI-3 flow.   Recommendations: Dual antiplatelet therapy with aspirin  and ticagrelor  for at least 12 months. Aggressive secondary prevention of coronary artery disease.   Lonni Hanson, MD [facility]    Physical Exam VS:  BP 106/64 (BP Location: Left Arm)   Pulse 82   Ht 5' 10 (1.778 m)   Wt 185 lb 9.6 oz (84.2 kg)   SpO2 98%   BMI 26.63 kg/m        Wt Readings from Last 3 Encounters:  03/30/24 185 lb 9.6 oz (84.2 kg)  12/30/23 190 lb 9.6 oz (86.5 kg)  11/04/23 195 lb 6.4 oz (88.6 kg)    GEN: Well nourished, well developed in no acute distress NECK: No JVD; No carotid bruits CARDIAC: S1/S2, RRR, no murmurs, rubs, gallops RESPIRATORY:  Clear to auscultation without rales, wheezing or rhonchi  ABDOMEN: Soft, non-tender, non-distended EXTREMITIES:  No edema; No deformity   ASSESSMENT AND PLAN  CAD, HLD Stable with no anginal symptoms. No indication for ischemic evaluation.  Continue aspirin , lisinopril, Toprol -XL, Brilinta , and spironolactone , as well as nitroglycerin  as needed. Cannot tolerate atorvastatin  - will add to allergy list. Heart healthy diet and regular cardiovascular exercise encouraged. Will recheck a fasting lipid profile later this month when he sees his PCP - will need to consider pravastatin in the future or consider Lipid Clinic referral.    HTN BP stable. Discussed to monitor BP at home at least 2 hours after medications and sitting for 5-10 minutes.  Continue current antihypertensive regimen. Discussed to monitor BP at home at least 2 hours after medications and sitting for 5-10 minutes. Heart healthy diet and regular cardiovascular exercise encouraged.   3. Leg pain, hip pain Symptoms are better since being off statin and seeing the chiropractor.  Will d/c atorvastatin  and  recheck FLP with his PCP and consider switching to pravastatin/ refer to Lipid Clinic once labs return and if they are not at goal which is highly likely. Will continue to follow.        Dispo: Follow-up with MD/APP in 6 months or sooner if any changes.  Signed, Almarie Crate, NP   "

## 2024-03-30 NOTE — Patient Instructions (Signed)
 Medication Instructions:  Your physician has recommended you make the following change in your medication:  Stop Atorvastatin   Continue taking all other medications as prescribed  Labwork: Fasting Lipid Panel at Primary Care Physician office visit  Lab Order given  Testing/Procedures: None  Follow-Up: Your physician recommends that you schedule a follow-up appointment in: 6 months  Any Other Special Instructions Will Be Listed Below (If Applicable).  Thank you for choosing [facility]!     If you need a refill on your cardiac medications before your next appointment, please call your pharmacy.

## 2024-04-15 ENCOUNTER — Telehealth: Payer: Self-pay | Admitting: Nurse Practitioner

## 2024-04-15 DIAGNOSIS — E785 Hyperlipidemia, unspecified: Secondary | ICD-10-CM

## 2024-04-15 DIAGNOSIS — E782 Mixed hyperlipidemia: Secondary | ICD-10-CM

## 2024-04-15 MED ORDER — PRAVASTATIN SODIUM 10 MG PO TABS: 10.0000 mg | ORAL_TABLET | Freq: Every day | ORAL | 5 refills | Status: AC

## 2024-04-15 NOTE — Telephone Encounter (Signed)
 I have spoke with patient and his labs have been scanned in and would like for you to review and  make a decision on his cholesterol medication. He is currently looking for a new PCP. Please advise

## 2024-04-15 NOTE — Telephone Encounter (Signed)
 Patient would like to have labs sent to PCP + he wants to discuss starting on a new cholesterol medication. Please advise.

## 2024-04-15 NOTE — Telephone Encounter (Signed)
 Sent patient MyChart message regarding changes made by Almarie:  I have reviewed his labs. They are elevated since being off atorvastatin , and he could not tolerate this. Please begin pravastatin  10 mg daily and repeat FLP/LFT in 2-3 months. Please also place referral to Primary Care in [place].    Thanks!   Best, Almarie Crate, NP
